# Patient Record
Sex: Male | Born: 1970 | Race: Black or African American | Hispanic: No | Marital: Married | Smoking: Current every day smoker
Health system: Southern US, Community
[De-identification: ages and names within clinical notes are randomized; demographics above are authoritative.]

## PROBLEM LIST (undated history)

## (undated) DIAGNOSIS — K219 Gastro-esophageal reflux disease without esophagitis: Secondary | ICD-10-CM

## (undated) DIAGNOSIS — W3400XA Accidental discharge from unspecified firearms or gun, initial encounter: Secondary | ICD-10-CM

## (undated) DIAGNOSIS — S92009A Unspecified fracture of unspecified calcaneus, initial encounter for closed fracture: Secondary | ICD-10-CM

## (undated) DIAGNOSIS — I10 Essential (primary) hypertension: Secondary | ICD-10-CM

## (undated) HISTORY — DX: Gastro-esophageal reflux disease without esophagitis: K21.9

---

## 2016-01-22 ENCOUNTER — Encounter (HOSPITAL_COMMUNITY): Payer: Self-pay | Admitting: Emergency Medicine

## 2016-01-22 DIAGNOSIS — F17200 Nicotine dependence, unspecified, uncomplicated: Secondary | ICD-10-CM | POA: Diagnosis not present

## 2016-01-22 DIAGNOSIS — M545 Low back pain: Secondary | ICD-10-CM | POA: Diagnosis not present

## 2016-01-22 DIAGNOSIS — I10 Essential (primary) hypertension: Secondary | ICD-10-CM | POA: Insufficient documentation

## 2016-01-22 LAB — URINALYSIS, ROUTINE W REFLEX MICROSCOPIC
Bilirubin Urine: NEGATIVE
GLUCOSE, UA: NEGATIVE mg/dL
HGB URINE DIPSTICK: NEGATIVE
KETONES UR: NEGATIVE mg/dL
LEUKOCYTES UA: NEGATIVE
Nitrite: NEGATIVE
PH: 8 (ref 5.0–8.0)
Protein, ur: NEGATIVE mg/dL
Specific Gravity, Urine: 1.021 (ref 1.005–1.030)

## 2016-01-22 MED ORDER — OXYCODONE-ACETAMINOPHEN 5-325 MG PO TABS
ORAL_TABLET | ORAL | Status: DC
Start: 2016-01-22 — End: 2016-01-23
  Filled 2016-01-22: qty 1

## 2016-01-22 MED ORDER — OXYCODONE-ACETAMINOPHEN 5-325 MG PO TABS
1.0000 | ORAL_TABLET | ORAL | Status: DC | PRN
  Administered 2016-01-22: 1 via ORAL

## 2016-01-22 NOTE — ED Notes (Addendum)
Pt. reports low back pain onset yesterday unrelieved by OTC Aleve, denies injury or fall , no urinary discomfort or hematuria , pain increases with movement / changing positions .

## 2016-01-23 ENCOUNTER — Emergency Department (HOSPITAL_COMMUNITY)
Admission: EM | Admit: 2016-01-23 | Discharge: 2016-01-23 | Disposition: A | Discharge status: Home / Self Care | Payer: Medicaid Other | Attending: Emergency Medicine | Admitting: Emergency Medicine

## 2016-01-23 HISTORY — DX: Essential (primary) hypertension: I10

## 2016-01-23 NOTE — ED Notes (Signed)
lwbs

## 2016-01-31 ENCOUNTER — Emergency Department (HOSPITAL_COMMUNITY): Payer: Medicaid Other

## 2016-01-31 ENCOUNTER — Encounter (HOSPITAL_COMMUNITY): Payer: Self-pay | Admitting: Emergency Medicine

## 2016-01-31 ENCOUNTER — Observation Stay (HOSPITAL_COMMUNITY)
Admission: EM | Admit: 2016-01-31 | Discharge: 2016-01-31 | Disposition: A | Discharge status: Home / Self Care | Payer: Medicaid Other | Attending: Family Medicine | Admitting: Family Medicine

## 2016-01-31 DIAGNOSIS — I10 Essential (primary) hypertension: Secondary | ICD-10-CM | POA: Diagnosis not present

## 2016-01-31 DIAGNOSIS — M940 Chondrocostal junction syndrome [Tietze]: Secondary | ICD-10-CM

## 2016-01-31 DIAGNOSIS — F17200 Nicotine dependence, unspecified, uncomplicated: Secondary | ICD-10-CM | POA: Insufficient documentation

## 2016-01-31 DIAGNOSIS — K219 Gastro-esophageal reflux disease without esophagitis: Secondary | ICD-10-CM | POA: Diagnosis not present

## 2016-01-31 DIAGNOSIS — R0602 Shortness of breath: Secondary | ICD-10-CM | POA: Insufficient documentation

## 2016-01-31 DIAGNOSIS — R079 Chest pain, unspecified: Principal | ICD-10-CM | POA: Diagnosis present

## 2016-01-31 DIAGNOSIS — K210 Gastro-esophageal reflux disease with esophagitis: Secondary | ICD-10-CM

## 2016-01-31 LAB — I-STAT TROPONIN, ED
TROPONIN I, POC: 0 ng/mL (ref 0.00–0.08)
Troponin i, poc: 0 ng/mL (ref 0.00–0.08)

## 2016-01-31 LAB — BASIC METABOLIC PANEL
Anion gap: 7 (ref 5–15)
BUN: 10 mg/dL (ref 6–20)
CALCIUM: 8.6 mg/dL — AB (ref 8.9–10.3)
CO2: 24 mmol/L (ref 22–32)
CREATININE: 1.06 mg/dL (ref 0.61–1.24)
Chloride: 110 mmol/L (ref 101–111)
Glucose, Bld: 92 mg/dL (ref 65–99)
POTASSIUM: 3.4 mmol/L — AB (ref 3.5–5.1)
Sodium: 141 mmol/L (ref 135–145)

## 2016-01-31 LAB — CBC
HCT: 35.1 % — ABNORMAL LOW (ref 39.0–52.0)
Hemoglobin: 11.8 g/dL — ABNORMAL LOW (ref 13.0–17.0)
MCH: 28.4 pg (ref 26.0–34.0)
MCHC: 33.6 g/dL (ref 30.0–36.0)
MCV: 84.4 fL (ref 78.0–100.0)
PLATELETS: 237 10*3/uL (ref 150–400)
RBC: 4.16 MIL/uL — ABNORMAL LOW (ref 4.22–5.81)
RDW: 13.6 % (ref 11.5–15.5)
WBC: 10.2 10*3/uL (ref 4.0–10.5)

## 2016-01-31 LAB — TROPONIN I: Troponin I: <0.03 ng/mL (ref <0.031)

## 2016-01-31 LAB — D-DIMER, QUANTITATIVE (NOT AT ARMC)

## 2016-01-31 MED ORDER — PANTOPRAZOLE SODIUM 40 MG IV SOLR: 40.0000 mg | Freq: Once | INTRAVENOUS | Status: DC

## 2016-01-31 MED ORDER — ESOMEPRAZOLE MAGNESIUM 40 MG PO CPDR: 40.0000 mg | DELAYED_RELEASE_CAPSULE | Freq: Every day | ORAL | Status: DC

## 2016-01-31 MED ORDER — SODIUM CHLORIDE 0.9 % IV SOLN
INTRAVENOUS | Status: DC
Start: 2016-01-31 — End: 2016-01-31
  Administered 2016-01-31: 14:00:00 via INTRAVENOUS

## 2016-01-31 MED ORDER — ONDANSETRON HCL 4 MG/2ML IJ SOLN
4.0000 mg | Freq: Once | INTRAMUSCULAR | Status: AC
  Administered 2016-01-31: 4 mg via INTRAVENOUS
  Filled 2016-01-31: qty 2

## 2016-01-31 MED ORDER — MORPHINE SULFATE (PF) 2 MG/ML IV SOLN
2.0000 mg | Freq: Once | INTRAVENOUS | Status: AC
  Administered 2016-01-31: 2 mg via INTRAVENOUS
  Filled 2016-01-31: qty 1

## 2016-01-31 MED ORDER — FAMOTIDINE 20 MG PO TABS: 20.0000 mg | ORAL_TABLET | Freq: Two times a day (BID) | ORAL | Status: DC

## 2016-01-31 MED ORDER — PANTOPRAZOLE SODIUM 40 MG IV SOLR
80.0000 mg | Freq: Once | INTRAVENOUS | Status: AC
  Administered 2016-01-31: 80 mg via INTRAVENOUS
  Filled 2016-01-31: qty 80

## 2016-01-31 MED ORDER — GI COCKTAIL ~~LOC~~
30.0000 mL | Freq: Once | ORAL | Status: AC
  Administered 2016-01-31: 30 mL via ORAL
  Filled 2016-01-31: qty 30

## 2016-01-31 MED ORDER — SODIUM CHLORIDE 0.9 % IV BOLUS (SEPSIS)
500.0000 mL | Freq: Once | INTRAVENOUS | Status: AC
  Administered 2016-01-31: 500 mL via INTRAVENOUS

## 2016-01-31 NOTE — ED Notes (Signed)
EMS - Central chest pain with sudden onset around 10am today with radiation to left chest.  3 Nitros and 324mg  Aspirin originally 6/10 down to 3/10 in pain.

## 2016-01-31 NOTE — ED Provider Notes (Addendum)
CSN: [id number]     Arrival date & time 01/31/16  1111 History   First MD Initiated Contact with Patient 01/31/16 1120     Chief Complaint  Patient presents with  . Chest Pain     (Consider location/radiation/quality/duration/timing/severity/associated sxs/prior Treatment) Patient is a 45 y.o. male presenting with chest pain. The history is provided by the patient and the EMS personnel.  Chest Pain Associated symptoms: shortness of breath   Associated symptoms: no abdominal pain, no back pain, no cough, no fever and no headache   Patient with acute onset of anterior chest pain left and right at 10:15 this morning. Associated with shortness of breath. Pain at its worst was 10 out of 10. Pain currently 2 out of 10. Associated with nausea no vomiting. Never had pain like this. Patient has cardiac risk factors of hypertension and began a smoker. No known cardiac disease. No family history of cardiac disease. Patient given aspirin and 3 nitroglycerin by EMS without significant change in the pain. Patient's pain is not changed by movement or changing positions in the chest. But the low back pain does do that. Patient's had low back pain chronically for a long period of time. He is not here for that. He is here for the anterior chest pain.  Past Medical History  Diagnosis Date  . Hypertension    History reviewed. No pertinent past surgical history. No family history on file. Social History  Substance Use Topics  . Smoking status: Current Every Day Smoker  . Smokeless tobacco: None  . Alcohol Use: Yes    Review of Systems  Constitutional: Negative for fever.  HENT: Negative for congestion.   Eyes: Negative for visual disturbance.  Respiratory: Positive for shortness of breath. Negative for cough.   Cardiovascular: Positive for chest pain. Negative for leg swelling.  Gastrointestinal: Negative for abdominal pain.  Genitourinary: Negative for dysuria.  Musculoskeletal: Negative for back  pain.  Skin: Negative for rash.  Neurological: Negative for headaches.  Hematological: Does not bruise/bleed easily.  Psychiatric/Behavioral: Negative for confusion.      Allergies  Review of patient's allergies indicates no known allergies.  Home Medications   Prior to Admission medications   Medication Sig Start Date End Date Taking? Authorizing Provider  naproxen sodium (ANAPROX) 220 MG tablet Take 220 mg by mouth 2 (two) times daily as needed (pain).   Yes Historical Provider, MD   BP 115/88 mmHg  Pulse 66  Temp(Src) 97.9 F (36.6 C) (Oral)  Resp 21  Ht  (1.778 m)  Wt 68.04 kg  BMI 21.52 kg/m2  SpO2 100% Physical Exam  Constitutional: He is oriented to person, place, and time. He appears well-developed and well-nourished. No distress.  HENT:  Head: Normocephalic and atraumatic.  Mouth/Throat: Oropharynx is clear and moist.  Eyes: Conjunctivae and EOM are normal. Pupils are equal, round, and reactive to light.  Neck: Normal range of motion. Neck supple.  Cardiovascular: Normal rate, regular rhythm and normal heart sounds.   Pulmonary/Chest: Effort normal and breath sounds normal. No respiratory distress. He exhibits no tenderness.  Abdominal: Soft. Bowel sounds are normal. There is no tenderness.  Musculoskeletal: Normal range of motion. He exhibits no edema.  Neurological: He is alert and oriented to person, place, and time. No cranial nerve deficit. He exhibits normal muscle tone. Coordination normal.  Skin: Skin is warm. No rash noted.  Nursing note and vitals reviewed.   ED Course  Procedures (including critical care time) Labs Review  Labs Reviewed  BASIC METABOLIC PANEL - Abnormal; Notable for the following:    Potassium 3.4 (*)    Calcium 8.6 (*)    All other components within normal limits  CBC - Abnormal; Notable for the following:    RBC 4.16 (*)    Hemoglobin 11.8 (*)    HCT 35.1 (*)    All other components within normal limits  D-DIMER,  QUANTITATIVE (NOT AT [facility])  I-STAT TROPOININ, ED   Results for orders placed or performed during the hospital encounter of 01/31/16  Basic metabolic panel  Result Value Ref Range   Sodium 141 135 - 145 mmol/L   Potassium 3.4 (L) 3.5 - 5.1 mmol/L   Chloride 110 101 - 111 mmol/L   CO2 24 22 - 32 mmol/L   Glucose, Bld 92 65 - 99 mg/dL   BUN 10 6 - 20 mg/dL   Creatinine, Ser 1.61 0.61 - 1.24 mg/dL   Calcium 8.6 (L) 8.9 - 10.3 mg/dL   GFR calc non Af Amer >60 >60 mL/min   GFR calc Af Amer >60 >60 mL/min   Anion gap 7 5 - 15  CBC  Result Value Ref Range   WBC 10.2 4.0 - 10.5 K/uL   RBC 4.16 (L) 4.22 - 5.81 MIL/uL   Hemoglobin 11.8 (L) 13.0 - 17.0 g/dL   HCT 09.6 (L) 04.5 - 40.9 %   MCV 84.4 78.0 - 100.0 fL   MCH 28.4 26.0 - 34.0 pg   MCHC 33.6 30.0 - 36.0 g/dL   RDW 81.1 91.4 - 78.2 %   Platelets 237 150 - 400 K/uL  D-dimer, quantitative (not at [facility])  Result Value Ref Range   D-Dimer, Quant <0.27 0.00 - 0.50 ug/mL-FEU  I-stat troponin, ED (not at [facility])  Result Value Ref Range   Troponin i, poc 0.00 0.00 - 0.08 ng/mL   Comment 3             Imaging Review Dg Chest 2 View  01/31/2016  CLINICAL DATA:  45 year old male with mid chest pain since this morning. Initial encounter. EXAM: CHEST  2 VIEW COMPARISON:  None. FINDINGS: Normal lung volumes. Normal cardiac size and mediastinal contours. Visualized tracheal air column is within normal limits. No pneumothorax, pulmonary edema, pleural effusion or consolidation. Very mild nonspecific streaky opacity in the lingula. This partially obscures the contour of the left hemidiaphragm the PA view. No osseous abnormality identified. IMPRESSION: Mild nonspecific opacity in the lingula.  Query bronchopneumonia. No pleural effusion or other acute cardiopulmonary abnormality. Electronically Signed   By: Odessa Fleming M.D.   On: 01/31/2016 11:50   I have personally reviewed and evaluated these images and lab results as part of my medical  decision-making.   EKG Interpretation   Date/Time:  Wednesday January 31 2016 11:16:54 EDT Ventricular Rate:  69 PR Interval:  166 QRS Duration: 84 QT Interval:  390 QTC Calculation: 418 R Axis:   78 Text Interpretation:  Sinus rhythm Ventricular premature complex LVH with  secondary repolarization abnormality No previous ECGs available Confirmed  by Diana Davenport  MD, Karri Kallenbach ([id number]) on 01/31/2016 11:20:11 AM      MDM   Final diagnoses:  Chest pain, unspecified chest pain type   Patient's chest x-ray raises concerns for lingular pneumonia. The patient's symptoms don't seem to fit that very well. Patient has no evidence of pulmonary embolus based on a negative d-dimer. Sats are in the upper 90s. Chest x-ray otherwise negative. First troponin negative.  Patient will be admitted to observation telemetry for chest pain rule out. Patient's never had pain like this before. Patient without leukocytosis no fevers. Feel that the pneumonia is an outlier. However something to keep in mind.     Vanetta MuldersScott Revan Gendron, MD 01/31/16 1519  Addendum:  Patient revealed to the admitting hospitalist that he has a history of reflux disease and that he's been out of his medicines and that this may be reminded him of what his reflexes acted up before. They're recommending GI cocktail Protonix will have to do a serial troponin. Perhaps if the chest pain improves significantly with that and if the second troponin is negative patient could be considered for discharge home. If he doesn't have significant improvement he probably will require admission.  Patient seen by Doretha Souavid Marilyn he will put a note on the chart. Patient's heart score is 2.  Vanetta MuldersScott Kennet Mccort, MD 01/31/16 1550

## 2016-01-31 NOTE — Consult Note (Signed)
[facility] Medical Consultation  Jadrian Bulman ZOX:[id number] DOB: 1971-09-20 DOA: 01/31/2016 PCP: No PCP Per Patient   Requesting physician: Date of consultation:  Reason for consultation: Impression/Recommendations Principal Problem:   Chest pain Active Problems:   GERD (gastroesophageal reflux disease)   Hypertension   Chest pain syndrome, cardiac versus GERD vs. MSK. Pain reproducible on exam. Heart score 2  EKG shows NSR with ventricular premature complexes/LVH  and  QTC 418.  CMET and CBC are essentially normal. Troponins x2 are negative DDMER <0.27  CXR with mild non specific opacity lingula without cardiopulmonary abnormality or pleural effusion, query bronchopneumonia per radiologist, but Negative per attending MD. Resp exam is negative.   Can be d/c home and follow up with PCP/cards if symptoms persist   GI cocktail and Protonix for GI symptoms.   ADDENDUM:patient is now pain free after receiving Protonix and GI cocktail. He can be discharged to home with a refill of Nexium  History of GERD. Patient takes Nexium as OP. He is followed at [facility] for severe GERD.  Recommend Protonix as Outpatient Avoid Anaprox as it may worsen GERD symptoms Follow at [facility] GI symptoms persist.   History Hypertension VSS  He is Not on meds    HPI: 45 year old man a history of GERD followed [facility], on chronic Nexium, presenting with sudden onset of CP since 10 am. Described as 6/10, sharp, worsened with deep inspiration and movement or exertion while at work. Patient took a baby aspirin.On transport he received NTG without much relief.  Denies any jaw pain. Denies any dizziness or falls.denies SOB or cough. Denies any fever or chills. Reported  Nausea when symptoms began, but no vomiting or diarrhea. He reports some bloating. He reports that run out of Nexium 2 days ago. Appetite is normal. Denies any leg swelling or calf pain. Denies any left arm pain. Denies any headaches or vision  changes. Denies any prior history of cardiac disease. The patient never had an echocardiogram or seen by Cardiology. No recent long distance trips. Denies any new stressors. No new meds. No new herbal supplements.He smokes. No ETOH or recreational drugs.  At the ED,patient received ASA and Nitroglycerin.  NSR with ventricular premature complexes/LVH  and  QTC 418.  CMET and CBC are essentially normal. Troponins are negative to date, 0.01. DDMER <0.27  CXR with mild non specific opacity lingula without cardiopulmonary abnormality or pleural effusion, query bronchopneumonia. VSS, Afebrile.   Review of Systems:  See HPI for significant positives, rest of ROS neg. A full 10 point Review of Systems was done, except as stated above, all other Review of Systems were negative.   Past Medical History  Diagnosis Date  . Hypertension    History reviewed. No pertinent past surgical history. Social History:  reports that he has been smoking.  He does not have any smokeless tobacco history on file. He reports that he drinks alcohol. He reports that he uses illicit drugs (Marijuana).  No Known Allergies No family history on file.  Prior to Admission medications   Medication Sig Start Date End Date Taking? Authorizing Provider  naproxen sodium (ANAPROX) 220 MG tablet Take 220 mg by mouth 2 (two) times daily as needed (pain).   Yes Historical Provider, MD   Blood pressure 125/87, pulse 76, temperature 97.9 F (36.6 C), temperature source Oral, resp. rate 17, height  (1.778 m), weight 68.04 kg (150 lb), SpO2 100 %.  Filed Vitals:   01/31/16 1500 01/31/16 1515  BP: 126/91 125/87  Pulse: 66 76  Temp:    Resp: 11 17   Physical Exam: General:  lying in bed in NAD, conversant HEENT:  appear Normal, Conjunctivae clear, PERRLA. Moist Oral Mucosa. Neck: Supple Neck, No JVD, No cervical lymphadenopathy appriciated, No Carotid Bruits. Lung: Symmetrical Chest wall movement, Good air movement  bilaterally, CTAB. Cardio: RRR, No Gallops, Rubs or Murmurs, No Parasternal Heave. Abdomen: Positive Bowel Sounds, Abdomen Soft, No tenderness, No organomegaly appreciated,No rebound -guarding or rigidity. Skin: No Cyanosis, Normal Skin Turgor, No Skin Rash or Bruise. Musculoskeletal: Good muscle tone, joints appear normal, no effusions, Normal ROM.  Left Chest wall pain reporducible Lymph: No Palpable Lymph Nodes in Neck or Axillae Psych : Normal affect and insight, Not Suicidal or Homicidal, Awake Alert, Oriented X 3.  Neuro: No F.N deficits, al  Cranial Nerves Intact, Strength 5/5 all 4 extremities, Sensation intact all 4 extremities, Plantars down going.   Labs on Admission:  Basic Metabolic Panel:  Recent Labs Lab 01/31/16 1129  NA 141  K 3.4*  CL 110  CO2 24  GLUCOSE 92  BUN 10  CREATININE 1.06  CALCIUM 8.6*   Liver Function Tests: No results for input(s): AST, ALT, ALKPHOS, BILITOT, PROT, ALBUMIN in the last 168 hours. No results for input(s): LIPASE, AMYLASE in the last 168 hours. No results for input(s): AMMONIA in the last 168 hours. CBC:  Recent Labs Lab 01/31/16 1129  WBC 10.2  HGB 11.8*  HCT 35.1*  MCV 84.4  PLT 237   Cardiac Enzymes: No results for input(s): CKTOTAL, CKMB, CKMBINDEX, TROPONINI in the last 168 hours. BNP: Invalid input(s): POCBNP CBG: No results for input(s): GLUCAP in the last 168 hours.  Radiological Exams on Admission: Dg Chest 2 View  01/31/2016  CLINICAL DATA:  45 year old male with mid chest pain since this morning. Initial encounter. EXAM: CHEST  2 VIEW COMPARISON:  None. FINDINGS: Normal lung volumes. Normal cardiac size and mediastinal contours. Visualized tracheal air column is within normal limits. No pneumothorax, pulmonary edema, pleural effusion or consolidation. Very mild nonspecific streaky opacity in the lingula. This partially obscures the contour of the left hemidiaphragm the PA view. No osseous abnormality identified.  IMPRESSION: Mild nonspecific opacity in the lingula.  Query bronchopneumonia. No pleural effusion or other acute cardiopulmonary abnormality. Electronically Signed   By: Odessa FlemingH  Hall M.D.   On: 01/31/2016 11:50      [facility] Hospital Medical CenterWERTMAN,Jaana Brodt E [facility]  If 7PM-7AM, please contact night-coverage [email or web address] Password [email or web address] 01/31/2016, 4:30 PM

## 2016-01-31 NOTE — ED Provider Notes (Signed)
5:06 PM care transferred to me. Patient's chest pain has completely resolved with GI cocktail. Discussed with Dr. Konrad DoloresMerrell who recommends discharge and they will place a consult note. Asks for a prescription to refill his Nexium. We'll also give Pepcid. Discussed that while this likely is GERD there is still a chance this is still cardiac and if his symptoms come back or worsen he needs to be reevaluated. Otherwise follow-up with a PCP.  Pricilla LovelessScott Billey Wojciak, MD 01/31/16 [phone]

## 2016-01-31 NOTE — ED Notes (Signed)
Patient alert and oriented at discharge.  Patient ambulatory with this RN to the waiting room.

## 2016-02-01 DIAGNOSIS — M940 Chondrocostal junction syndrome [Tietze]: Secondary | ICD-10-CM | POA: Insufficient documentation

## 2016-04-30 ENCOUNTER — Encounter (HOSPITAL_COMMUNITY): Payer: Self-pay | Admitting: Emergency Medicine

## 2016-04-30 ENCOUNTER — Emergency Department (HOSPITAL_COMMUNITY)
Admission: EM | Admit: 2016-04-30 | Discharge: 2016-04-30 | Disposition: A | Discharge status: Home / Self Care | Payer: MEDICAID | Attending: Emergency Medicine | Admitting: Emergency Medicine

## 2016-04-30 DIAGNOSIS — IMO0001 Reserved for inherently not codable concepts without codable children: Secondary | ICD-10-CM

## 2016-04-30 DIAGNOSIS — F17210 Nicotine dependence, cigarettes, uncomplicated: Secondary | ICD-10-CM | POA: Insufficient documentation

## 2016-04-30 DIAGNOSIS — R030 Elevated blood-pressure reading, without diagnosis of hypertension: Secondary | ICD-10-CM | POA: Insufficient documentation

## 2016-04-30 DIAGNOSIS — Z79899 Other long term (current) drug therapy: Secondary | ICD-10-CM | POA: Insufficient documentation

## 2016-04-30 NOTE — Discharge Instructions (Signed)
Please follow-up with your doctor or the community health [facility] wellness [facility] to establish primary care and for blood pressure recheck and management. Be sure to drink lots of water, 8, 8 ounce glasses per day. Return to ED for any new or worsening symptoms.  Hypertension Hypertension is another name for high blood pressure. High blood pressure forces your heart to work harder to pump blood. A blood pressure reading has two numbers, which includes a higher number over a lower number (example: 110/72). HOME CARE   Have your blood pressure rechecked by your doctor.  Only take medicine as told by your doctor. Follow the directions carefully. The medicine does not work as well if you skip doses. Skipping doses also puts you at risk for problems.  Do not smoke.  Monitor your blood pressure at home as told by your doctor. GET HELP IF:  You think you are having a reaction to the medicine you are taking.  You have repeat headaches or feel dizzy.  You have puffiness (swelling) in your ankles.  You have trouble with your vision. GET HELP RIGHT AWAY IF:   You get a very bad headache and are confused.  You feel weak, numb, or faint.  You get chest or belly (abdominal) pain.  You throw up (vomit).  You cannot breathe very well. MAKE SURE YOU:   Understand these instructions.  Will watch your condition.  Will get help right away if you are not doing well or get worse.   This information is not intended to replace advice given to you by your health care provider. Make sure you discuss any questions you have with your health care provider.   Document Released: 04/08/2008 Document Revised: 10/26/2013 Document Reviewed: 08/13/2013 Elsevier Interactive Patient Education [employer]2016 Elsevier Inc.

## 2016-04-30 NOTE — ED Notes (Signed)
PA at bedside.

## 2016-04-30 NOTE — ED Provider Notes (Signed)
CSN: [id number]     Arrival date & time 04/30/16  1250 History   First MD Initiated Contact with Patient 04/30/16 1351     Chief Complaint  Patient presents with  . Hypertension     (Consider location/radiation/quality/duration/timing/severity/associated sxs/prior Treatment) HPI Eric Parsons is a 45 y.o. male with a reported history of hypertension, here for evaluation of elevated blood pressure. Patient reports he was at [facility], checked his blood pressure was 140/97. He reports running out of his previous blood pressure medications about 4 months ago when he moved from [place]. He does not know what medications he was taking. He denies any headache, vision changes, urinary symptoms, chest pain or shortness of breath. 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 He reports the sensation resolved shortly. Denies adequate water hydration. No other focal numbness or weakness. No other modifying factors.  Past Medical History  Diagnosis Date  . Hypertension    History reviewed. No pertinent past surgical history. Family History  Problem Relation Age of Onset  . Hypertension Mother   . Diabetes Mother    Social History  Substance Use Topics  . Smoking status: Current Every Day Smoker -- 0.50 packs/day    Types: Cigarettes  . Smokeless tobacco: None  . Alcohol Use: Yes    Review of Systems A 10 point review of systems was completed and was negative except for pertinent positives and negatives as mentioned in the history of present illness     Allergies  Review of patient's allergies indicates no known allergies.  Home Medications   Prior to Admission medications   Medication Sig Start Date End Date Taking? Authorizing Provider  esomeprazole (NEXIUM) 40 MG capsule Take 1 capsule (40 mg total) by mouth daily. 01/31/16  Yes Pricilla LovelessScott Goldston, MD  famotidine (PEPCID) 20 MG tablet Take 1 tablet (20 mg total) by mouth 2  (two) times daily. 01/31/16  Yes Pricilla LovelessScott Goldston, MD   BP 148/110 mmHg  Pulse 68  Temp(Src) 98.4 F (36.9 C) (Oral)  Resp 17  Ht 5\' 10"  (1.778 m)  Wt 68.04 kg  BMI 21.52 kg/m2  SpO2 100% Physical Exam  Constitutional: He is oriented to person, place, and time. He appears well-developed and well-nourished. No distress.  HENT:  Head: Normocephalic and atraumatic.  Mouth/Throat: Oropharynx is clear and moist.  Eyes: Conjunctivae and EOM are normal. Pupils are equal, round, and reactive to light. Right eye exhibits no discharge. Left eye exhibits no discharge. No scleral icterus.  Neck: Normal range of motion. Neck supple.  Cardiovascular: Normal rate, regular rhythm and normal heart sounds.   Pulmonary/Chest: Effort normal and breath sounds normal. No respiratory distress. He has no wheezes. He has no rales.  Abdominal: Soft. There is no tenderness.  Musculoskeletal: Normal range of motion. He exhibits no edema or tenderness.  Neurological: He is alert and oriented to person, place, and time.  Cranial Nerves II-XII grossly intact  Skin: Skin is warm and dry. No rash noted. He is not diaphoretic.  Psychiatric: He has a normal mood and affect.  Nursing note and vitals reviewed.   ED Course  Procedures (including critical care time) Labs Review Labs Reviewed - No data to display  Imaging Review No results found. I have personally reviewed and evaluated these images and lab results as part of my medical decision-making.   EKG Interpretation None      MDM  Overall  well-appearing in here for evaluation of hypertension. He [facility] and came here for evaluation. No evidence of hypertensive emergency or urgency. Patient is asymptomatic in emergency department. Discussed importance of oral rehydration. Will give a referral to [facility] wellness Center for PCP follow-up and blood pressure recheck. Final diagnoses:  Elevated blood  pressure        Joycie PeekBenjamin Kiowa Peifer, PA-C 04/30/16 1558  Derwood KaplanAnkit Nanavati, MD 05/01/16 1413

## 2016-04-30 NOTE — ED Notes (Signed)
Patient presents for hypertension. Reports [facility] to [facility] and [facility] was 140[facility]97. Patient c/o lightheadedness and generalized weakness. History of hypertension, ran out of medication "months ago". Patient A&O x4, ambulatory with steady gait.

## 2016-05-17 ENCOUNTER — Emergency Department (HOSPITAL_COMMUNITY)
Admission: EM | Admit: 2016-05-17 | Discharge: 2016-05-17 | Disposition: A | Discharge status: Home / Self Care | Payer: Self-pay | Attending: Emergency Medicine | Admitting: Emergency Medicine

## 2016-05-17 ENCOUNTER — Encounter (HOSPITAL_COMMUNITY): Payer: Self-pay | Admitting: Emergency Medicine

## 2016-05-17 ENCOUNTER — Emergency Department (HOSPITAL_COMMUNITY): Payer: Self-pay

## 2016-05-17 DIAGNOSIS — R112 Nausea with vomiting, unspecified: Secondary | ICD-10-CM | POA: Insufficient documentation

## 2016-05-17 DIAGNOSIS — F17210 Nicotine dependence, cigarettes, uncomplicated: Secondary | ICD-10-CM | POA: Insufficient documentation

## 2016-05-17 DIAGNOSIS — R109 Unspecified abdominal pain: Secondary | ICD-10-CM

## 2016-05-17 DIAGNOSIS — R197 Diarrhea, unspecified: Secondary | ICD-10-CM | POA: Insufficient documentation

## 2016-05-17 DIAGNOSIS — R1084 Generalized abdominal pain: Secondary | ICD-10-CM | POA: Insufficient documentation

## 2016-05-17 DIAGNOSIS — Z79899 Other long term (current) drug therapy: Secondary | ICD-10-CM | POA: Insufficient documentation

## 2016-05-17 DIAGNOSIS — I10 Essential (primary) hypertension: Secondary | ICD-10-CM | POA: Insufficient documentation

## 2016-05-17 LAB — COMPREHENSIVE METABOLIC PANEL
ALBUMIN: 3.4 g/dL — AB (ref 3.5–5.0)
ALK PHOS: 45 U/L (ref 38–126)
ALT: 19 U/L (ref 17–63)
ANION GAP: 11 (ref 5–15)
AST: 20 U/L (ref 15–41)
BILIRUBIN TOTAL: 0.4 mg/dL (ref 0.3–1.2)
BUN: 12 mg/dL (ref 6–20)
CALCIUM: 9.2 mg/dL (ref 8.9–10.3)
CO2: 24 mmol/L (ref 22–32)
Chloride: 103 mmol/L (ref 101–111)
Creatinine, Ser: 1.23 mg/dL (ref 0.61–1.24)
GFR calc non Af Amer: >60 mL/min (ref >60)
Glucose, Bld: 94 mg/dL (ref 65–99)
POTASSIUM: 3 mmol/L — AB (ref 3.5–5.1)
SODIUM: 138 mmol/L (ref 135–145)
TOTAL PROTEIN: 6.3 g/dL — AB (ref 6.5–8.1)

## 2016-05-17 LAB — URINALYSIS, ROUTINE W REFLEX MICROSCOPIC
BILIRUBIN URINE: NEGATIVE
Glucose, UA: NEGATIVE mg/dL
HGB URINE DIPSTICK: NEGATIVE
Ketones, ur: NEGATIVE mg/dL
Leukocytes, UA: NEGATIVE
NITRITE: NEGATIVE
PROTEIN: NEGATIVE mg/dL
Specific Gravity, Urine: 1.031 — ABNORMAL HIGH (ref 1.005–1.030)
pH: 7.5 (ref 5.0–8.0)

## 2016-05-17 LAB — CBC
HCT: 38.3 % — ABNORMAL LOW (ref 39.0–52.0)
HEMOGLOBIN: 13 g/dL (ref 13.0–17.0)
MCH: 28.4 pg (ref 26.0–34.0)
MCHC: 33.9 g/dL (ref 30.0–36.0)
MCV: 83.6 fL (ref 78.0–100.0)
PLATELETS: 228 10*3/uL (ref 150–400)
RBC: 4.58 MIL/uL (ref 4.22–5.81)
RDW: 13.5 % (ref 11.5–15.5)
WBC: 8.9 10*3/uL (ref 4.0–10.5)

## 2016-05-17 LAB — MAGNESIUM: MAGNESIUM: 2 mg/dL (ref 1.7–2.4)

## 2016-05-17 LAB — TROPONIN I: Troponin I: <0.03 ng/mL (ref <0.03)

## 2016-05-17 LAB — LIPASE, BLOOD: Lipase: 23 U/L (ref 11–51)

## 2016-05-17 LAB — POC OCCULT BLOOD, ED: Fecal Occult Bld: NEGATIVE

## 2016-05-17 MED ORDER — ONDANSETRON HCL 4 MG PO TABS: 4.0000 mg | ORAL_TABLET | Freq: Four times a day (QID) | ORAL | Status: DC

## 2016-05-17 MED ORDER — MORPHINE SULFATE (PF) 4 MG/ML IV SOLN
4.0000 mg | Freq: Once | INTRAVENOUS | Status: AC
  Administered 2016-05-17: 4 mg via INTRAVENOUS
  Filled 2016-05-17: qty 1

## 2016-05-17 MED ORDER — POTASSIUM CHLORIDE CRYS ER 20 MEQ PO TBCR
40.0000 meq | EXTENDED_RELEASE_TABLET | Freq: Once | ORAL | Status: AC
  Administered 2016-05-17: 40 meq via ORAL
  Filled 2016-05-17: qty 2

## 2016-05-17 MED ORDER — SODIUM CHLORIDE 0.9 % IV BOLUS (SEPSIS)
1000.0000 mL | Freq: Once | INTRAVENOUS | Status: AC
  Administered 2016-05-17: 1000 mL via INTRAVENOUS

## 2016-05-17 MED ORDER — SUCRALFATE 1 GM/10ML PO SUSP: 1.0000 g | Freq: Three times a day (TID) | ORAL | Status: DC

## 2016-05-17 MED ORDER — IOPAMIDOL (ISOVUE-300) INJECTION 61%
INTRAVENOUS | Status: AC
  Administered 2016-05-17: 100 mL
  Filled 2016-05-17: qty 100

## 2016-05-17 MED ORDER — ONDANSETRON HCL 4 MG/2ML IJ SOLN
4.0000 mg | Freq: Once | INTRAMUSCULAR | Status: AC
  Administered 2016-05-17: 4 mg via INTRAVENOUS
  Filled 2016-05-17: qty 2

## 2016-05-17 NOTE — ED Notes (Signed)
Occult Blood Card at bedside.  

## 2016-05-17 NOTE — ED Notes (Signed)
Pt tolerating water with no vomitting

## 2016-05-17 NOTE — ED Notes (Signed)
Pt in from home with c/o n/v/d, abd pain and cp x4 days. Per pt, this has been occuring off and on x 7 years. Pt reports having dark, oily emesis and dark stools since last night. Pt takes Nexium and Pepcid for heartburn. A&0x4, VSS, afebrile

## 2016-05-17 NOTE — Discharge Instructions (Signed)
Medications: Zofran, Carafate  Treatment: Take Zofran every 6 hours as needed for nausea and vomiting. Take Carafate 4 times daily with meals and at bedtime for your pain and stomach upset. Take Tylenol over-the-counter every 4-6 hours as prescribed for your pain. Do not take NSAID medications like ibuprofen or Aleve until you see the gastroenterologist.  Follow-up: Please follow-up with [facility] for further evaluation and treatment of your symptoms. Please return to emergency department if you develop any new or worsening symptoms such as inability to keep fluids down, bloody stools, fever, increasing pain, or any other new or concerning symptom.   Abdominal Pain, Adult Many things can cause abdominal pain. Usually, abdominal pain is not caused by a disease and will improve without treatment. It can often be observed and treated at home. Your health care provider will do a physical exam and possibly order blood tests and X-rays to help determine the seriousness of your pain. However, in many cases, more time must pass before a clear cause of the pain can be found. Before that point, your health care provider may not know if you need more testing or further treatment. HOME CARE INSTRUCTIONS Monitor your abdominal pain for any changes. The following actions may help to alleviate any discomfort you are experiencing:  Only take over-the-counter or prescription medicines as directed by your health care provider.  Do not take laxatives unless directed to do so by your health care provider.  Try a clear liquid diet (broth, tea, or water) as directed by your health care provider. Slowly move to a bland diet as tolerated. SEEK MEDICAL CARE IF:  You have unexplained abdominal pain.  You have abdominal pain associated with nausea or diarrhea.  You have pain when you urinate or have a bowel movement.  You experience abdominal pain that wakes you in the night.  You have abdominal pain  that is worsened or improved by eating food.  You have abdominal pain that is worsened with eating fatty foods.  You have a fever. SEEK IMMEDIATE MEDICAL CARE IF:  Your pain does not go away within 2 hours.  You keep throwing up (vomiting).  Your pain is felt only in portions of the abdomen, such as the right side or the left lower portion of the abdomen.  You pass bloody or black tarry stools. MAKE SURE YOU:  Understand these instructions.  Will watch your condition.  Will get help right away if you are not doing well or get worse.   This information is not intended to replace advice given to you by your health care provider. Make sure you discuss any questions you have with your health care provider.   Document Released: 07/31/2005 Document Revised: 07/12/2015 Document Reviewed: 06/30/2013 Elsevier Interactive Patient Education 2016 Elsevier Inc.   Nausea and Vomiting Nausea is a sick feeling that often comes before throwing up (vomiting). Vomiting is a reflex where stomach contents come out of your mouth. Vomiting can cause severe loss of body fluids (dehydration). Children and elderly adults can become dehydrated quickly, especially if they also have diarrhea. Nausea and vomiting are symptoms of a condition or disease. It is important to find the cause of your symptoms. CAUSES   Direct irritation of the stomach lining. This irritation can result from increased acid production (gastroesophageal reflux disease), infection, food poisoning, taking certain medicines (such as nonsteroidal anti-inflammatory drugs), alcohol use, or tobacco use.  Signals from the brain.These signals could be caused by a headache, heat exposure, an  inner ear disturbance, increased pressure in the brain from injury, infection, a tumor, or a concussion, pain, emotional stimulus, or metabolic problems.  An obstruction in the gastrointestinal tract (bowel obstruction).  Illnesses such as diabetes,  hepatitis, gallbladder problems, appendicitis, kidney problems, cancer, sepsis, atypical symptoms of a heart attack, or eating disorders.  Medical treatments such as chemotherapy and radiation.  Receiving medicine that makes you sleep (general anesthetic) during surgery. DIAGNOSIS Your caregiver may ask for tests to be done if the problems do not improve after a few days. Tests may also be done if symptoms are severe or if the reason for the nausea and vomiting is not clear. Tests may include:  Urine tests.  Blood tests.  Stool tests.  Cultures (to look for evidence of infection).  X-rays or other imaging studies. Test results can help your caregiver make decisions about treatment or the need for additional tests. TREATMENT You need to stay well hydrated. Drink frequently but in small amounts.You may wish to drink water, sports drinks, clear broth, or eat frozen ice pops or gelatin dessert to help stay hydrated.When you eat, eating slowly may help prevent nausea.There are also some antinausea medicines that may help prevent nausea. HOME CARE INSTRUCTIONS   Take all medicine as directed by your caregiver.  If you do not have an appetite, do not force yourself to eat. However, you must continue to drink fluids.  If you have an appetite, eat a normal diet unless your caregiver tells you differently.  Eat a variety of complex carbohydrates (rice, wheat, potatoes, bread), lean meats, yogurt, fruits, and vegetables.  Avoid high-fat foods because they are more difficult to digest.  Drink enough water and fluids to keep your urine clear or pale yellow.  If you are dehydrated, ask your caregiver for specific rehydration instructions. Signs of dehydration may include:  Severe thirst.  Dry lips and mouth.  Dizziness.  Dark urine.  Decreasing urine frequency and amount.  Confusion.  Rapid breathing or pulse. SEEK IMMEDIATE MEDICAL CARE IF:   You have blood or brown flecks  (like coffee grounds) in your vomit.  You have black or bloody stools.  You have a severe headache or stiff neck.  You are confused.  You have severe abdominal pain.  You have chest pain or trouble breathing.  You do not urinate at least once every 8 hours.  You develop cold or clammy skin.  You continue to vomit for longer than 24 to 48 hours.  You have a fever. MAKE SURE YOU:   Understand these instructions.  Will watch your condition.  Will get help right away if you are not doing well or get worse.   This information is not intended to replace advice given to you by your health care provider. Make sure you discuss any questions you have with your health care provider.   Document Released: 10/21/2005 Document Revised: 01/13/2012 Document Reviewed: 03/20/2011 Elsevier Interactive Patient Education [employer]2016 Elsevier Inc.

## 2016-05-17 NOTE — ED Provider Notes (Signed)
CSN: [id number]     Arrival date & time 05/17/16  0801 History   First MD Initiated Contact with Patient 05/17/16 0809     Chief Complaint  Patient presents with  . Abdominal Pain  . Nausea  . Vomiting     (Consider location/radiation/quality/duration/timing/severity/associated sxs/prior Treatment) HPI Comments: Patient is a 45 year old male who presents with a three-day history of abdominal pain, nausea, vomiting, diarrhea. Patient locates his constant abdominal exam the right upper quadrant, periumbilical, right lower quadrant and radiates to his right low back. Patient has also had constant central chest pain. Patient reports that he has had a dark brown oily vomit, but denies any blood. Patient states he vomited 6-7 times since Tuesday. Patient has also had dark loose stools. He states he was back and forth as well as all day yesterday. Patient has been taking Prilosec without relief. Patient has had these similar symptoms in the past off and on for 7 years. His wife states that it happens about 5-7 years and he sees emergency department every time. He has never had a CT scan or ultrasound. He has been told it was acid reflux and/or ulcers. He has never seen a gastroenterologist and does not have a primary care provider. Patient denies any shortness of breath, fevers, dysuria.  Patient is a 45 y.o. male presenting with abdominal pain. The history is provided by the patient.  Abdominal Pain Associated symptoms: chest pain, diarrhea, nausea and vomiting   Associated symptoms: no chills, no dysuria, no fever, no shortness of breath and no sore throat     Past Medical History  Diagnosis Date  . Hypertension    History reviewed. No pertinent past surgical history. Family History  Problem Relation Age of Onset  . Hypertension Mother   . Diabetes Mother    Social History  Substance Use Topics  . Smoking status: Current Every Day Smoker -- 0.50 packs/day    Types: Cigarettes  .  Smokeless tobacco: Current User    Types: Chew  . Alcohol Use: Yes    Review of Systems  Constitutional: Negative for fever and chills.  HENT: Negative for facial swelling and sore throat.   Respiratory: Negative for shortness of breath.   Cardiovascular: Positive for chest pain.  Gastrointestinal: Positive for nausea, vomiting, abdominal pain and diarrhea.  Genitourinary: Negative for dysuria.  Musculoskeletal: Negative for back pain.  Skin: Negative for rash and wound.  Neurological: Negative for headaches.  Psychiatric/Behavioral: The patient is not nervous/anxious.       Allergies  Review of patient's allergies indicates no known allergies.  Home Medications   Prior to Admission medications   Medication Sig Start Date End Date Taking? Authorizing Provider  ibuprofen (ADVIL,MOTRIN) 200 MG tablet Take 400 mg by mouth every 6 (six) hours as needed for mild pain.   Yes Historical Provider, MD  omeprazole (PRILOSEC) 20 MG capsule Take 20 mg by mouth daily as needed (heartburn / indigestion).   Yes Historical Provider, MD  esomeprazole (NEXIUM) 40 MG capsule Take 1 capsule (40 mg total) by mouth daily. Patient not taking: Reported on 05/17/2016 01/31/16   Pricilla LovelessScott Goldston, MD  famotidine (PEPCID) 20 MG tablet Take 1 tablet (20 mg total) by mouth 2 (two) times daily. Patient not taking: Reported on 05/17/2016 01/31/16   Pricilla LovelessScott Goldston, MD  ondansetron (ZOFRAN) 4 MG tablet Take 1 tablet (4 mg total) by mouth every 6 (six) hours. 05/17/16   Emi HolesAlexandra M Berwyn Bigley, PA-C  sucralfate (CARAFATE) 1 GM/10ML  suspension Take 10 mLs (1 g total) by mouth 4 (four) times daily -  with meals and at bedtime. 05/17/16   Salik Grewell M Richey Doolittle, PA-C   BP 153/94 mmHg  Pulse 70  Temp(Src) 97.7 F (36.5 C) (Oral)  Resp 15  SpO2 100% Physical Exam  Constitutional: He appears well-developed and well-nourished. No distress.  HENT:  Head: Normocephalic and atraumatic.  Mouth/Throat: Oropharynx is clear and moist. No  oropharyngeal exudate.  Eyes: Conjunctivae are normal. Pupils are equal, round, and reactive to light. Right eye exhibits no discharge. Left eye exhibits no discharge. No scleral icterus.  Neck: Normal range of motion. Neck supple. No thyromegaly present.  Cardiovascular: Normal rate, regular rhythm, normal heart sounds and intact distal pulses.  Exam reveals no gallop and no friction rub.   No murmur heard. Pulmonary/Chest: Effort normal and breath sounds normal. No stridor. No respiratory distress. He has no wheezes. He has no rales. He exhibits no tenderness.  Abdominal: Soft. Bowel sounds are normal. He exhibits no distension. There is tenderness in the right upper quadrant, right lower quadrant and periumbilical area. There is CVA tenderness (R side only), tenderness at McBurney's point and positive Murphy's sign. There is no rebound and no guarding.  Musculoskeletal: He exhibits no edema.  Lymphadenopathy:    He has no cervical adenopathy.  Neurological: He is alert. Coordination normal.  Skin: Skin is warm and dry. No rash noted. He is not diaphoretic. No pallor.  Psychiatric: He has a normal mood and affect.  Nursing note and vitals reviewed.   ED Course  Procedures (including critical care time) Labs Review Labs Reviewed  COMPREHENSIVE METABOLIC PANEL - Abnormal; Notable for the following:    Potassium 3.0 (*)    Total Protein 6.3 (*)    Albumin 3.4 (*)    All other components within normal limits  CBC - Abnormal; Notable for the following:    HCT 38.3 (*)    All other components within normal limits  URINALYSIS, ROUTINE W REFLEX MICROSCOPIC (NOT AT [facility]) - Abnormal; Notable for the following:    Color, Urine AMBER (*)    Specific Gravity, Urine 1.031 (*)    All other components within normal limits  LIPASE, BLOOD  TROPONIN I  MAGNESIUM  OCCULT BLOOD X 1 CARD TO LAB, STOOL  POC OCCULT BLOOD, ED    Imaging Review Dg Chest 2 View  05/17/2016  CLINICAL DATA:  Chest and  abdominal pain since Tuesday, hypertension, smoker EXAM: CHEST  2 VIEW COMPARISON:  01/31/2016 FINDINGS: Normal heart size, mediastinal contours, and pulmonary vascularity. Minimal hyperinflation. No acute infiltrate, pleural effusion or pneumothorax. Faint nodular foci due to EKG leads. No acute osseous findings. No free air under the hemidiaphragms. IMPRESSION: No acute abnormalities. Electronically Signed   By: Ulyses Southward M.D.   On: 05/17/2016 09:55   Ct Abdomen Pelvis W Contrast  05/17/2016  CLINICAL DATA:  Right upper quadrant pain nausea and vomiting starting Tuesday EXAM: CT ABDOMEN AND PELVIS WITH CONTRAST TECHNIQUE: Multidetector CT imaging of the abdomen and pelvis was performed using the standard protocol following bolus administration of intravenous contrast. CONTRAST:  ISOVUE-300 IOPAMIDOL (ISOVUE-300) INJECTION 61% COMPARISON:  None. FINDINGS: Lower chest:  The lung bases are unremarkable. Hepatobiliary: Enhanced liver is unremarkable. No focal mass. A a tiny cyst is noted in right hepatic lobe anteriorly measures 4 mm. Pancreas: Enhanced pancreas is unremarkable. Spleen: Enhanced spleen is unremarkable. Adrenals/Urinary Tract: No adrenal gland mass. Enhanced kidneys are symmetrical in  size. No hydronephrosis or hydroureter. Delayed renal images shows bilateral renal symmetrical excretion. Bilateral visualized proximal ureter is unremarkable. The urinary bladder is empty limiting its assessment. Stomach/Bowel: There is no gastric outlet obstruction. No small bowel obstruction. No thickened or dilated small bowel loops. No pericecal inflammation. Normal appendix is noted in axial image 44. No distal colitis or diverticulitis. Vascular/Lymphatic: No aortic aneurysm. No retroperitoneal or mesenteric adenopathy. No inguinal adenopathy. Reproductive: Prostate gland and seminal vesicles are unremarkable. No pelvic mass is noted. Other: No ascites or free abdominal air. Musculoskeletal: No  destructive bony lesions are noted. Sagittal images of the spine are unremarkable. Minimal degenerative changes with anterior spurring lower thoracic spine. IMPRESSION: 1. There is no evidence of acute inflammatory process within abdomen or pelvis. 2. Normal appendix.  No pericecal inflammation. 3. No hydronephrosis or hydroureter. 4. No small bowel obstruction. 5. Limited assessment of urinary bladder which is empty. Electronically Signed   By: Natasha Mead M.D.   On: 05/17/2016 09:54   I have personally reviewed and evaluated these images and lab results as part of my medical decision-making.   EKG Interpretation   Date/Time:  Friday May 17 2016 08:07:36 EDT Ventricular Rate:  76 PR Interval:    QRS Duration: 85 QT Interval:  453 QTC Calculation: 510 R Axis:   85 Text Interpretation:  Sinus rhythm Consider left atrial enlargement Left  ventricular hypertrophy Prolonged QT interval No significant change since  last tracing Confirmed by NANAVATI, MD, ANKIT [phone]) on 05/17/2016 9:59:10  AM      1125 Patient tolerating fluids without N/V 1153 Patient given crackers, PO challenge, still no N/V  MDM   EKG shows NSR, possible left atrial enlargement, LVH, prolonged QT, but no significant change since last tracing. CBC unremarkable. CMP shows potassium 3, protein 6.3, albumin 3.4. Magnesium 2.0. Lipase 23. Troponin <0.03. UA shows specific gravity 1.031. Fecal occult negative. CXR shows no acute abnormalities. CT abdomen and pelvis shows no evidence of acute inflammatory process; normal appendix and no pericecal inflammation; no hydronephrosis or hydroureter; no small bowel obstruction. Patient able to tolerate fluids and food in ED without nausea or vomiting. Will discharge patient home with Zofran, Carafate. Patient to follow-up with GI for further evaluation. Patient and significant other understand and agree with plan. Strict return precautions discussed. Patient vitals stable throughout ED  course and discharged in satisfactory condition. Patient also evaluated by Dr. Rhunette Croft who is in agreement with plan.  Final diagnoses:  Abdominal pain, unspecified abdominal location  Non-intractable vomiting with nausea, vomiting of unspecified type  Diarrhea, unspecified type        Emi Holes, PA-C 05/17/16 1231  Derwood Kaplan, MD 05/19/16 [id number]

## 2016-05-17 NOTE — ED Notes (Signed)
Pt taken to CT.

## 2016-07-21 ENCOUNTER — Encounter (HOSPITAL_COMMUNITY): Payer: Self-pay

## 2016-07-21 ENCOUNTER — Emergency Department (HOSPITAL_COMMUNITY)
Admission: EM | Admit: 2016-07-21 | Discharge: 2016-07-21 | Disposition: A | Discharge status: Home / Self Care | Payer: Self-pay | Attending: Emergency Medicine | Admitting: Emergency Medicine

## 2016-07-21 ENCOUNTER — Emergency Department (HOSPITAL_COMMUNITY): Payer: Self-pay

## 2016-07-21 DIAGNOSIS — J029 Acute pharyngitis, unspecified: Secondary | ICD-10-CM | POA: Insufficient documentation

## 2016-07-21 DIAGNOSIS — M25561 Pain in right knee: Secondary | ICD-10-CM | POA: Insufficient documentation

## 2016-07-21 DIAGNOSIS — I10 Essential (primary) hypertension: Secondary | ICD-10-CM | POA: Insufficient documentation

## 2016-07-21 DIAGNOSIS — Z79899 Other long term (current) drug therapy: Secondary | ICD-10-CM | POA: Insufficient documentation

## 2016-07-21 DIAGNOSIS — F17210 Nicotine dependence, cigarettes, uncomplicated: Secondary | ICD-10-CM | POA: Insufficient documentation

## 2016-07-21 LAB — RAPID STREP SCREEN (MED CTR MEBANE ONLY): STREPTOCOCCUS, GROUP A SCREEN (DIRECT): NEGATIVE

## 2016-07-21 NOTE — ED Notes (Signed)
Pt to xray

## 2016-07-21 NOTE — ED Provider Notes (Signed)
[facility]-EMERGENCY DEPT Provider Note   CSN: [id number] Arrival date & time: 07/21/16  1809   By signing my name below, I, Christel MormonMatthew Jamison, attest that this documentation has been prepared under the direction and in the presence of Roxy Horsemanobert Haiven Nardone, PA-C. Electronically Signed: Christel MormonMatthew Jamison, Scribe. 07/21/2016. 7:37 PM.   History   Chief Complaint Chief Complaint  Patient presents with  . Leg Pain    The history is provided by the patient. No language interpreter was used.   HPI Comments:  Eric Parsons is a 45 y.o. male who presents to the Emergency Department complaining of constant, radiating pain in his R leg with intermittent swelling x1 week. Pt states that the pain radiates from his R knee to his R ankle. Pt describes the pain as soreness to the back of his R knee. Pt reports associated difficulty extending his R leg and difficulty walking. Pt states that he used Bengay and wrapped his R knee and then noticed swelling and numbness while at work. Pt notes that he traveled to and from [facility] today and does not travel often. Pt denies injury.   Pt also complains of painful "burning" in his throat when swallowing that he first noticed this morning. Pt states that he has never had any similar episodes previously. Pt complains of subjective fever and states that 1 week ago he began having hot flashes and felt like he was going to lose consciousness in the shower. Pt denies sore throat, rhinorrhea.  Past Medical History:  Diagnosis Date  . Hypertension     Patient Active Problem List   Diagnosis Date Noted  . Costochondritis   . Chest pain 01/31/2016  . GERD (gastroesophageal reflux disease) 01/31/2016  . Hypertension 01/31/2016    History reviewed. No pertinent surgical history.     Home Medications    Prior to Admission medications   Medication Sig Start Date End Date Taking? Authorizing Provider  esomeprazole (NEXIUM) 40 MG capsule Take 1 capsule (40 mg total) by mouth  daily. Patient not taking: Reported on 05/17/2016 01/31/16   Pricilla LovelessScott Goldston, MD  famotidine (PEPCID) 20 MG tablet Take 1 tablet (20 mg total) by mouth 2 (two) times daily. Patient not taking: Reported on 05/17/2016 01/31/16   Pricilla LovelessScott Goldston, MD  ibuprofen (ADVIL,MOTRIN) 200 MG tablet Take 400 mg by mouth every 6 (six) hours as needed for mild pain.    Historical Provider, MD  omeprazole (PRILOSEC) 20 MG capsule Take 20 mg by mouth daily as needed (heartburn / indigestion).    Historical Provider, MD  ondansetron (ZOFRAN) 4 MG tablet Take 1 tablet (4 mg total) by mouth every 6 (six) hours. 05/17/16   Emi HolesAlexandra M Law, PA-C  sucralfate (CARAFATE) 1 GM/10ML suspension Take 10 mLs (1 g total) by mouth 4 (four) times daily -  with meals and at bedtime. 05/17/16   Emi HolesAlexandra M Law, PA-C    Family History Family History  Problem Relation Age of Onset  . Hypertension Mother   . Diabetes Mother     Social History Social History  Substance Use Topics  . Smoking status: Current Every Day Smoker    Packs/day: 0.50    Types: Cigarettes  . Smokeless tobacco: Current User    Types: Chew  . Alcohol use Yes     Allergies   Review of patient's allergies indicates no known allergies.   Review of Systems Review of Systems  Constitutional: Positive for fever.  HENT: Positive for trouble swallowing. Negative for rhinorrhea and  sore throat.   Musculoskeletal: Positive for arthralgias, gait problem and joint swelling.  All other systems reviewed and are negative.    Physical Exam Updated Vital Signs BP 147/93 (BP Location: Left Arm)   Pulse 96   Temp 98.8 F (37.1 C) (Oral)   Resp 16   SpO2 100%   Physical Exam  Constitutional: He is oriented to person, place, and time. He appears well-developed and well-nourished.  HENT:  Head: Normocephalic and atraumatic.  Oropharynx mildly erythematous, no tonsillar exudates, no abscess  Eyes: Conjunctivae and EOM are normal. Pupils are equal, round, and  reactive to light. Right eye exhibits no discharge. Left eye exhibits no discharge. No scleral icterus.  Neck: Normal range of motion. Neck supple. No JVD present.  Cardiovascular: Normal rate, regular rhythm and normal heart sounds.  Exam reveals no gallop and no friction rub.   No murmur heard. Pulmonary/Chest: Effort normal and breath sounds normal. No respiratory distress. He has no wheezes. He has no rales. He exhibits no tenderness.  Abdominal: Soft. He exhibits no distension and no mass. There is no tenderness. There is no rebound and no guarding.  Musculoskeletal: Normal range of motion. He exhibits no edema or tenderness.  No swelling or deformity about the right knee, normal range of motion strength, no evidence of DVT or septic joint  Neurological: He is alert and oriented to person, place, and time.  Skin: Skin is warm and dry.  No erythema, cellulitis, or abscess  Psychiatric: He has a normal mood and affect. His behavior is normal. Judgment and thought content normal.  Nursing note and vitals reviewed.    ED Treatments / Results  DIAGNOSTIC STUDIES:  Oxygen Saturation is 100% on RA, normal by my interpretation.    COORDINATION OF CARE:  7:37 PM Discussed treatment plan with pt at bedside and pt agreed to plan.   Labs (all labs ordered are listed, but only abnormal results are displayed) Labs Reviewed - No data to display  EKG  EKG Interpretation None       Radiology No results found.  Procedures Procedures (including critical care time)  Medications Ordered in ED Medications - No data to display   Initial Impression / Assessment and Plan / ED Course  I have reviewed the triage vital signs and the nursing notes.  Pertinent labs & imaging results that were available during my care of the patient were reviewed by me and considered in my medical decision making (see chart for details).  Clinical Course    Patient with 2 complaints. Right knee pain.  Plain films are negative. No swelling about the knee. He has had some radiating symptoms, question whether he has a Baker's cyst. There is no evidence of septic joint. Patient will be given a knee immobilizer and crutches and recommend orthopedic follow-up. Regarding his sore throat, he has had some fevers and chills. Strep test is negative. Likely viral. Recommend supportive care.  Final Clinical Impressions(s) / ED Diagnoses   Final diagnoses:  Right knee pain  Sore throat    New Prescriptions Discharge Medication List as of 07/21/2016  9:10 PM    I personally performed the services described in this documentation, which was scribed in my presence. The recorded information has been reviewed and is accurate.      Roxy Horseman, PA-C 07/22/16 0126    Gilda Crease, MD 07/22/16 [phone]

## 2016-07-21 NOTE — ED Triage Notes (Signed)
Patient complains of right leg pain and intermittent swelling x 1 week, states started after standing on feet all week at work, denies trauma. Minimal to no swelling noted. Also concerned that he feels a burning when he swallows, no sore throat, only when he eats something, NAD

## 2016-07-23 LAB — CULTURE, GROUP A STREP (THRC)

## 2016-08-12 ENCOUNTER — Emergency Department (HOSPITAL_COMMUNITY)
Admission: EM | Admit: 2016-08-12 | Discharge: 2016-08-12 | Disposition: A | Discharge status: Home / Self Care | Payer: Self-pay | Attending: Physician Assistant | Admitting: Physician Assistant

## 2016-08-12 ENCOUNTER — Emergency Department (HOSPITAL_COMMUNITY): Payer: Self-pay

## 2016-08-12 ENCOUNTER — Encounter (HOSPITAL_COMMUNITY): Payer: Self-pay

## 2016-08-12 DIAGNOSIS — F17210 Nicotine dependence, cigarettes, uncomplicated: Secondary | ICD-10-CM | POA: Insufficient documentation

## 2016-08-12 DIAGNOSIS — K219 Gastro-esophageal reflux disease without esophagitis: Secondary | ICD-10-CM | POA: Insufficient documentation

## 2016-08-12 DIAGNOSIS — Z79899 Other long term (current) drug therapy: Secondary | ICD-10-CM | POA: Insufficient documentation

## 2016-08-12 DIAGNOSIS — I10 Essential (primary) hypertension: Secondary | ICD-10-CM | POA: Insufficient documentation

## 2016-08-12 DIAGNOSIS — R079 Chest pain, unspecified: Secondary | ICD-10-CM

## 2016-08-12 LAB — CBC
HCT: 38.1 % — ABNORMAL LOW (ref 39.0–52.0)
HEMOGLOBIN: 12.3 g/dL — AB (ref 13.0–17.0)
MCH: 27.6 pg (ref 26.0–34.0)
MCHC: 32.3 g/dL (ref 30.0–36.0)
MCV: 85.6 fL (ref 78.0–100.0)
Platelets: 235 10*3/uL (ref 150–400)
RBC: 4.45 MIL/uL (ref 4.22–5.81)
RDW: 13.9 % (ref 11.5–15.5)
WBC: 7.4 10*3/uL (ref 4.0–10.5)

## 2016-08-12 LAB — BASIC METABOLIC PANEL
ANION GAP: 7 (ref 5–15)
BUN: 11 mg/dL (ref 6–20)
CALCIUM: 9.1 mg/dL (ref 8.9–10.3)
CO2: 28 mmol/L (ref 22–32)
Chloride: 106 mmol/L (ref 101–111)
Creatinine, Ser: 1.13 mg/dL (ref 0.61–1.24)
Glucose, Bld: 97 mg/dL (ref 65–99)
Potassium: 3.4 mmol/L — ABNORMAL LOW (ref 3.5–5.1)
SODIUM: 141 mmol/L (ref 135–145)

## 2016-08-12 LAB — I-STAT TROPONIN, ED: TROPONIN I, POC: 0 ng/mL (ref 0.00–0.08)

## 2016-08-12 MED ORDER — AMLODIPINE BESYLATE 5 MG PO TABS
5.0000 mg | ORAL_TABLET | Freq: Once | ORAL | Status: AC
  Administered 2016-08-12: 5 mg via ORAL
  Filled 2016-08-12: qty 1

## 2016-08-12 MED ORDER — GI COCKTAIL ~~LOC~~
30.0000 mL | Freq: Once | ORAL | Status: AC
  Administered 2016-08-12: 30 mL via ORAL
  Filled 2016-08-12: qty 30

## 2016-08-12 MED ORDER — ESOMEPRAZOLE MAGNESIUM 40 MG PO CPDR: 40.0000 mg | DELAYED_RELEASE_CAPSULE | Freq: Every day | ORAL | 0 refills | Status: DC

## 2016-08-12 MED ORDER — AMLODIPINE BESYLATE 5 MG PO TABS: 5.0000 mg | ORAL_TABLET | Freq: Every day | ORAL | 0 refills | Status: DC

## 2016-08-12 NOTE — ED Provider Notes (Signed)
[facility] Provider Note   CSN: [id number] Arrival date & time: 08/12/16  0751     History   Chief Complaint Chief Complaint  Patient presents with  . Chest Pain    chest pain    HPI Eric Parsons is a 45 y.o. male.  The history is provided by the patient and medical records.    45 year old male with history of hypertension not currently on medication, presenting to the ED for chest pain. Patient states this began yesterday afternoon around 1 PM and has been persistent throughout the night into this morning.  He states it is a midsternal, burning sensation consistent with his acid reflux. He denies any shortness of breath, palpitations, dizziness, or weakness.  He has no known cardiac history. He is a daily smoker. States he has not been on any hypertensive medications in several months.  States he took it briefly, however it was so long ago he cannot recall what his medications were.  States he was previously taking Nexium for his GERD, has not had that in several months due to financial reasons.  States he does not have a PCP so he usually just comes here when he has an issue.  Past Medical History:  Diagnosis Date  . Hypertension     Patient Active Problem List   Diagnosis Date Noted  . Costochondritis   . Chest pain 01/31/2016  . GERD (gastroesophageal reflux disease) 01/31/2016  . Hypertension 01/31/2016    History reviewed. No pertinent surgical history.     Home Medications    Prior to Admission medications   Medication Sig Start Date End Date Taking? Authorizing Provider  esomeprazole (NEXIUM) 40 MG capsule Take 1 capsule (40 mg total) by mouth daily. Patient not taking: Reported on 05/17/2016 01/31/16   Pricilla Loveless, MD  famotidine (PEPCID) 20 MG tablet Take 1 tablet (20 mg total) by mouth 2 (two) times daily. Patient not taking: Reported on 05/17/2016 01/31/16   Pricilla Loveless, MD  ibuprofen (ADVIL,MOTRIN) 200 MG tablet Take 400 mg by mouth every 6  (six) hours as needed for mild pain.    Historical Provider, MD  omeprazole (PRILOSEC) 20 MG capsule Take 20 mg by mouth daily as needed (heartburn / indigestion).    Historical Provider, MD  ondansetron (ZOFRAN) 4 MG tablet Take 1 tablet (4 mg total) by mouth every 6 (six) hours. 05/17/16   Emi Holes, PA-C  sucralfate (CARAFATE) 1 GM/10ML suspension Take 10 mLs (1 g total) by mouth 4 (four) times daily -  with meals and at bedtime. 05/17/16   Emi Holes, PA-C    Family History Family History  Problem Relation Age of Onset  . Hypertension Mother   . Diabetes Mother     Social History Social History  Substance Use Topics  . Smoking status: Current Every Day Smoker    Packs/day: 0.50    Types: Cigarettes  . Smokeless tobacco: Current User    Types: Chew  . Alcohol use Yes     Allergies   Review of patient's allergies indicates no known allergies.   Review of Systems Review of Systems  Cardiovascular: Positive for chest pain.  All other systems reviewed and are negative.    Physical Exam Updated Vital Signs BP 154/99 (BP Location: Left Arm)   Pulse 72   Temp 97.9 F (36.6 C)   Resp 18   Ht 5\' 10"  (1.778 m)   Wt 68 kg   SpO2 100%   BMI  21.52 kg/m   Physical Exam  Constitutional: He is oriented to person, place, and time. He appears well-developed and well-nourished.  HENT:  Head: Normocephalic and atraumatic.  Mouth/Throat: Oropharynx is clear and moist.  Eyes: Conjunctivae and EOM are normal. Pupils are equal, round, and reactive to light.  Neck: Normal range of motion.  Cardiovascular: Normal rate, regular rhythm and normal heart sounds.   Pulmonary/Chest: Effort normal and breath sounds normal. He has no decreased breath sounds. He has no wheezes.  Chest wall is non-tender, lungs clear bilaterally  Abdominal: Soft. Bowel sounds are normal.  Musculoskeletal: Normal range of motion.  Neurological: He is alert and oriented to person, place, and time.    Skin: Skin is warm and dry.  Psychiatric: He has a normal mood and affect.  Nursing note and vitals reviewed.    ED Treatments / Results  Labs (all labs ordered are listed, but only abnormal results are displayed) Labs Reviewed  BASIC METABOLIC PANEL - Abnormal; Notable for the following:       Result Value   Potassium 3.4 (*)    All other components within normal limits  CBC - Abnormal; Notable for the following:    Hemoglobin 12.3 (*)    HCT 38.1 (*)    All other components within normal limits  I-STAT TROPOININ, ED    EKG  EKG Interpretation  Date/Time:  Monday August 12 2016 07:56:13 EDT Ventricular Rate:  76 PR Interval:  162 QRS Duration: 86 QT Interval:  416 QTC Calculation: 468 R Axis:   87 Text Interpretation:  Sinus rhythm with occasional Premature ventricular complexes Left ventricular hypertrophy with repolarization abnormality Abnormal ECG Confirmed by BELFI  MD, MELANIE ([id number]) on 08/12/2016 8:22:10 AM Also confirmed by BELFI  MD, MELANIE ([id number]), editor [place] CT, Jola Babinski [phone])  on 08/12/2016 8:22:58 AM       Radiology Dg Chest 2 View  Result Date: 08/12/2016 CLINICAL DATA:  Chest pain with shortness of breath EXAM: CHEST  2 VIEW COMPARISON:  May 17, 2016 FINDINGS: There is no edema or consolidation. Heart size and pulmonary vascularity are normal. No adenopathy. No pneumothorax. No bone lesions. IMPRESSION: No edema or consolidation. Electronically Signed   By: Bretta Bang III M.D.   On: 08/12/2016 08:39    Procedures Procedures (including critical care time)  Medications Ordered in ED Medications  gi cocktail (Maalox,Lidocaine,Donnatal) (30 mLs Oral Given 08/12/16 0953)  amLODipine (NORVASC) tablet 5 mg (5 mg Oral Given 08/12/16 0959)     Initial Impression / Assessment and Plan / ED Course  I have reviewed the triage vital signs and the nursing notes.  Pertinent labs & imaging results that were available during my care of the patient  were reviewed by me and considered in my medical decision making (see chart for details).  Clinical Course   45 year old male here with chest pain. States it feels like his usual GERD. Began yesterday at 1 PM and has been persistent since that time. No known cardiac history. EKG here is nonischemic. Labwork is reassuring. Chest x-ray is clear. His pain fully resolved after GI cocktail. He was initially hypertensive, it appears that he was previously on Norvasc. He was given dose of this year with improvement of his blood pressure. He remains asymptomatic of this. There are no signs of end organ damage today.  Given negative work-up here today after 12+ hours of symptoms and good resolution of symptoms with GI cocktail, do feel that this is likely GERD  related.  Lower suspicion for ACS, PE, dissection, or other acute cardiac death. Patient will be discharged home with refills of his medication. He does not currently have a PCP or insurance, was given referral to the [facility] for follow-up.  Discussed plan with patient and wife, they both acknowledged understanding and agreed with plan of care.  Return precautions given for new or worsening symptoms.  Final Clinical Impressions(s) / ED Diagnoses   Final diagnoses:  Chest pain, unspecified type  Gastroesophageal reflux disease without esophagitis  Hypertension, unspecified type    New Prescriptions Discharge Medication List as of 08/12/2016 12:03 PM    START taking these medications   Details  amLODipine (NORVASC) 5 MG tablet Take 1 tablet (5 mg total) by mouth daily., Starting Mon 08/12/2016, Print         Garlon HatchetLisa M Karriem Muench, PA-C 08/12/16 1235    Courteney Lyn Mackuen, MD 08/13/16 1016

## 2016-08-12 NOTE — ED Triage Notes (Signed)
Pt states that he has chest pain that started last night pt has been seen here multiple times for chest pain

## 2016-08-12 NOTE — Discharge Instructions (Signed)
Take the prescribed medication as directed. [facility]-- call there today to make an appt. Return to the ED for new or worsening symptoms.

## 2016-08-19 ENCOUNTER — Encounter (HOSPITAL_COMMUNITY): Payer: Self-pay | Admitting: *Deleted

## 2016-08-19 DIAGNOSIS — I10 Essential (primary) hypertension: Secondary | ICD-10-CM | POA: Insufficient documentation

## 2016-08-19 DIAGNOSIS — F17210 Nicotine dependence, cigarettes, uncomplicated: Secondary | ICD-10-CM | POA: Insufficient documentation

## 2016-08-19 DIAGNOSIS — K2900 Acute gastritis without bleeding: Secondary | ICD-10-CM | POA: Insufficient documentation

## 2016-08-19 LAB — CBC WITH DIFFERENTIAL/PLATELET
Basophils Absolute: 0 10*3/uL (ref 0.0–0.1)
Basophils Relative: 0 %
EOS PCT: 0 %
Eosinophils Absolute: 0 10*3/uL (ref 0.0–0.7)
HCT: 39.4 % (ref 39.0–52.0)
Hemoglobin: 13.5 g/dL (ref 13.0–17.0)
LYMPHS ABS: 2.8 10*3/uL (ref 0.7–4.0)
LYMPHS PCT: 27 %
MCH: 28.2 pg (ref 26.0–34.0)
MCHC: 34.3 g/dL (ref 30.0–36.0)
MCV: 82.4 fL (ref 78.0–100.0)
MONO ABS: 0.8 10*3/uL (ref 0.1–1.0)
MONOS PCT: 8 %
Neutro Abs: 7 10*3/uL (ref 1.7–7.7)
Neutrophils Relative %: 65 %
PLATELETS: 260 10*3/uL (ref 150–400)
RBC: 4.78 MIL/uL (ref 4.22–5.81)
RDW: 13.7 % (ref 11.5–15.5)
WBC: 10.7 10*3/uL — ABNORMAL HIGH (ref 4.0–10.5)

## 2016-08-19 LAB — URINE MICROSCOPIC-ADD ON

## 2016-08-19 LAB — URINALYSIS, ROUTINE W REFLEX MICROSCOPIC
Glucose, UA: NEGATIVE mg/dL
Hgb urine dipstick: NEGATIVE
Ketones, ur: 15 mg/dL — AB
Leukocytes, UA: NEGATIVE
NITRITE: NEGATIVE
PROTEIN: 30 mg/dL — AB
SPECIFIC GRAVITY, URINE: 1.029 (ref 1.005–1.030)
pH: 8 (ref 5.0–8.0)

## 2016-08-19 LAB — COMPREHENSIVE METABOLIC PANEL
ALT: 18 U/L (ref 17–63)
AST: 24 U/L (ref 15–41)
Albumin: 4.1 g/dL (ref 3.5–5.0)
Alkaline Phosphatase: 45 U/L (ref 38–126)
Anion gap: 11 (ref 5–15)
BILIRUBIN TOTAL: 1 mg/dL (ref 0.3–1.2)
BUN: 10 mg/dL (ref 6–20)
CHLORIDE: 100 mmol/L — AB (ref 101–111)
CO2: 26 mmol/L (ref 22–32)
Calcium: 9.7 mg/dL (ref 8.9–10.3)
Creatinine, Ser: 1.2 mg/dL (ref 0.61–1.24)
Glucose, Bld: 100 mg/dL — ABNORMAL HIGH (ref 65–99)
POTASSIUM: 3.5 mmol/L (ref 3.5–5.1)
Sodium: 137 mmol/L (ref 135–145)
TOTAL PROTEIN: 7.3 g/dL (ref 6.5–8.1)

## 2016-08-19 LAB — LIPASE, BLOOD: LIPASE: 27 U/L (ref 11–51)

## 2016-08-19 MED ORDER — ONDANSETRON 4 MG PO TBDP
ORAL_TABLET | ORAL | Status: AC
  Administered 2016-08-19: 4 mg
  Filled 2016-08-19: qty 1

## 2016-08-19 MED ORDER — ONDANSETRON 4 MG PO TBDP: 4.0000 mg | ORAL_TABLET | Freq: Once | ORAL | Status: DC

## 2016-08-19 NOTE — ED Triage Notes (Signed)
Patient presents with c/o abd pain for several days.  Seen for the same 4 days ago.

## 2016-08-20 ENCOUNTER — Emergency Department (HOSPITAL_COMMUNITY)
Admission: EM | Admit: 2016-08-20 | Discharge: 2016-08-20 | Disposition: A | Discharge status: Home / Self Care | Payer: Self-pay | Attending: Emergency Medicine | Admitting: Emergency Medicine

## 2016-08-20 DIAGNOSIS — K2900 Acute gastritis without bleeding: Secondary | ICD-10-CM

## 2016-08-20 MED ORDER — SODIUM CHLORIDE 0.9 % IV BOLUS (SEPSIS)
1000.0000 mL | Freq: Once | INTRAVENOUS | Status: AC
  Administered 2016-08-20: 1000 mL via INTRAVENOUS

## 2016-08-20 MED ORDER — GI COCKTAIL ~~LOC~~
30.0000 mL | Freq: Once | ORAL | Status: AC
  Administered 2016-08-20: 30 mL via ORAL
  Filled 2016-08-20: qty 30

## 2016-08-20 MED ORDER — HYDROMORPHONE HCL 1 MG/ML IJ SOLN
1.0000 mg | Freq: Once | INTRAMUSCULAR | Status: AC
  Administered 2016-08-20: 1 mg via INTRAVENOUS
  Filled 2016-08-20: qty 1

## 2016-08-20 MED ORDER — ONDANSETRON HCL 4 MG/2ML IJ SOLN
4.0000 mg | Freq: Once | INTRAMUSCULAR | Status: AC
  Administered 2016-08-20: 4 mg via INTRAVENOUS
  Filled 2016-08-20: qty 2

## 2016-08-20 MED ORDER — SUCRALFATE 1 GM/10ML PO SUSP: 1.0000 g | Freq: Three times a day (TID) | ORAL | 0 refills | Status: DC

## 2016-08-20 MED ORDER — RANITIDINE HCL 150 MG PO TABS: 150.0000 mg | ORAL_TABLET | Freq: Two times a day (BID) | ORAL | 0 refills | Status: DC

## 2016-08-20 NOTE — ED Provider Notes (Signed)
[facility] Provider Note   CSN: [id number] Arrival date & time: 08/19/16  2041  By signing my name below, I, Rosario Adie, attest that this documentation has been prepared under the direction and in the presence of Gilda Crease, MD. Electronically Signed: Rosario Adie, ED Scribe. 08/20/16. 2:33 AM.  History   Chief Complaint Chief Complaint  Patient presents with  . Abdominal Pain  . Emesis   The history is provided by the patient and medical records. No language interpreter was used.   HPI Comments: Eric Parsons is a 45 y.o. male with a PMHx of GERD and HTN, who presents to the Emergency Department complaining of gradual onset, constant epigastric abdominal pain onset several day ago. Pt reports associated intermittent nausea and vomiting secondary to his abdominal pain. Pt has been seen for same in the ED several times over the past ~6 months for same, most recently ~4 days ago. Prior chart review reveals that all of his previous workups, including a CT A/P, have all been otherwise unremarkable. No alleviating or exacerbating factors noted. He is not currently followed by a Gastroenterologist. Denies bloody stools, fever, diarrhea, or any other associated symptoms.   Past Medical History:  Diagnosis Date  . Hypertension    Patient Active Problem List   Diagnosis Date Noted  . Costochondritis   . Chest pain 01/31/2016  . GERD (gastroesophageal reflux disease) 01/31/2016  . Hypertension 01/31/2016   History reviewed. No pertinent surgical history.  Home Medications    Prior to Admission medications   Medication Sig Start Date End Date Taking? Authorizing Provider  ibuprofen (ADVIL,MOTRIN) 200 MG tablet Take 400 mg by mouth every 6 (six) hours as needed for mild pain.   Yes Historical Provider, MD  amLODipine (NORVASC) 5 MG tablet Take 1 tablet (5 mg total) by mouth daily. Patient not taking: Reported on 08/19/2016 08/12/16   Garlon Hatchet,  PA-C  esomeprazole (NEXIUM) 40 MG capsule Take 1 capsule (40 mg total) by mouth daily. Patient not taking: Reported on 08/19/2016 08/12/16   Garlon Hatchet, PA-C  famotidine (PEPCID) 20 MG tablet Take 1 tablet (20 mg total) by mouth 2 (two) times daily. Patient not taking: Reported on 08/19/2016 01/31/16   Pricilla Loveless, MD  ondansetron (ZOFRAN) 4 MG tablet Take 1 tablet (4 mg total) by mouth every 6 (six) hours. Patient not taking: Reported on 08/19/2016 05/17/16   Waylan Boga Law, PA-C  sucralfate (CARAFATE) 1 GM/10ML suspension Take 10 mLs (1 g total) by mouth 4 (four) times daily -  with meals and at bedtime. Patient not taking: Reported on 08/19/2016 05/17/16   Emi Holes, PA-C   Family History Family History  Problem Relation Age of Onset  . Hypertension Mother   . Diabetes Mother    Social History Social History  Substance Use Topics  . Smoking status: Current Every Day Smoker    Packs/day: 0.50    Types: Cigarettes  . Smokeless tobacco: Current User    Types: Chew  . Alcohol use Yes   Allergies   Review of patient's allergies indicates no known allergies.  Review of Systems Review of Systems  Constitutional: Negative for fever.  Gastrointestinal: Positive for abdominal pain, nausea and vomiting. Negative for blood in stool and diarrhea.  All other systems reviewed and are negative.  Physical Exam Updated Vital Signs BP (!) 160/115   Pulse 73   Temp 98.1 F (36.7 C) (Oral)   Resp 19  Wt 150 lb (68 kg)   SpO2 100%   BMI 21.52 kg/m   Physical Exam  Constitutional: He is oriented to person, place, and time. He appears well-developed and well-nourished. No distress.  HENT:  Head: Normocephalic and atraumatic.  Right Ear: Hearing normal.  Left Ear: Hearing normal.  Nose: Nose normal.  Mouth/Throat: Oropharynx is clear and moist and mucous membranes are normal.  Eyes: Conjunctivae and EOM are normal. Pupils are equal, round, and reactive to light.  Neck:  Normal range of motion. Neck supple.  Cardiovascular: Regular rhythm, S1 normal and S2 normal.  Exam reveals no gallop and no friction rub.   No murmur heard. Pulmonary/Chest: Effort normal and breath sounds normal. No respiratory distress. He exhibits no tenderness.  Abdominal: Soft. Normal appearance and bowel sounds are normal. There is no hepatosplenomegaly. There is no tenderness. There is no rebound, no guarding, no tenderness at McBurney's point and negative Murphy's sign. No hernia.  Musculoskeletal: Normal range of motion.  Neurological: He is alert and oriented to person, place, and time. He has normal strength. No cranial nerve deficit or sensory deficit. Coordination normal. GCS eye subscore is 4. GCS verbal subscore is 5. GCS motor subscore is 6.  Skin: Skin is warm, dry and intact. No rash noted. No cyanosis.  Psychiatric: He has a normal mood and affect. His speech is normal and behavior is normal. Thought content normal.  Nursing note and vitals reviewed.  ED Treatments / Results  DIAGNOSTIC STUDIES: Oxygen Saturation is 100% on RA, normal by my interpretation.   COORDINATION OF CARE: 2:33 AM-Discussed next steps with pt. Pt verbalized understanding and is agreeable with the plan.   Labs (all labs ordered are listed, but only abnormal results are displayed) Labs Reviewed  CBC WITH DIFFERENTIAL/PLATELET - Abnormal; Notable for the following:       Result Value   WBC 10.7 (*)    All other components within normal limits  COMPREHENSIVE METABOLIC PANEL - Abnormal; Notable for the following:    Chloride 100 (*)    Glucose, Bld 100 (*)    All other components within normal limits  URINALYSIS, ROUTINE W REFLEX MICROSCOPIC (NOT AT [facility]) - Abnormal; Notable for the following:    Color, Urine RED (*)    Bilirubin Urine SMALL (*)    Ketones, ur 15 (*)    Protein, ur 30 (*)    All other components within normal limits  URINE MICROSCOPIC-ADD ON - Abnormal; Notable for the  following:    Squamous Epithelial / LPF 0-5 (*)    Bacteria, UA RARE (*)    All other components within normal limits  LIPASE, BLOOD    EKG  EKG Interpretation None       Radiology No results found.  Procedures Procedures (including critical care time)  Medications Ordered in ED Medications  ondansetron (ZOFRAN-ODT) disintegrating tablet 4 mg (4 mg Oral Not Given 08/19/16 2056)  ondansetron (ZOFRAN-ODT) 4 MG disintegrating tablet (4 mg  Given 08/19/16 2056)  sodium chloride 0.9 % bolus 1,000 mL (0 mLs Intravenous Stopped 08/20/16 0407)  HYDROmorphone (DILAUDID) injection 1 mg (1 mg Intravenous Given 08/20/16 0242)  ondansetron (ZOFRAN) injection 4 mg (4 mg Intravenous Given 08/20/16 0242)  gi cocktail (Maalox,Lidocaine,Donnatal) (30 mLs Oral Given 08/20/16 0242)     Initial Impression / Assessment and Plan / ED Course  I have reviewed the triage vital signs and the nursing notes.  Pertinent labs & imaging results that were available during my  care of the patient were reviewed by me and considered in my medical decision making (see chart for details).  Clinical Course    Patient presents to the ER for evaluation of abdominal pain. Patient has recurrent epigastric pain has been ongoing for some time. It was accompanied by nausea and vomiting. He has not had hematemesis or melanotic stools. Blood work including hemoglobin is normal. LFTs are normal. Examination reveals mild epigastric tenderness without Murphy's sign or right upper quadrant tenderness. No lower abdominal or pelvic tenderness. Negative Murphy's tenderness.  He has had multiple workups in the past including CT scan for this. I do not believe he requires additional imaging. He is pain-free after treatment here in the ER. Presentation most consistent with gastritis, cannot rule out peptic ulcer disease. Will refer to gastroenterology on call.  Final Clinical Impressions(s) / ED Diagnoses   Final diagnoses:    Acute gastritis without hemorrhage, unspecified gastritis type    New Prescriptions New Prescriptions   No medications on file   I personally performed the services described in this documentation, which was scribed in my presence. The recorded information has been reviewed and is accurate.     Gilda Crease, MD 08/20/16 [phone]

## 2016-08-20 NOTE — ED Notes (Signed)
Pt has epigastric pain "ongoing for years(per family member)." Pt has had NV, denies diarrhea. Also reports belching causes worsening, burning pain.

## 2016-09-17 ENCOUNTER — Emergency Department (HOSPITAL_COMMUNITY)
Admission: EM | Admit: 2016-09-17 | Discharge: 2016-09-18 | Disposition: A | Discharge status: Home / Self Care | Payer: Self-pay | Attending: Emergency Medicine | Admitting: Emergency Medicine

## 2016-09-17 ENCOUNTER — Encounter (HOSPITAL_COMMUNITY): Payer: Self-pay

## 2016-09-17 DIAGNOSIS — F17210 Nicotine dependence, cigarettes, uncomplicated: Secondary | ICD-10-CM | POA: Insufficient documentation

## 2016-09-17 DIAGNOSIS — R1013 Epigastric pain: Secondary | ICD-10-CM | POA: Insufficient documentation

## 2016-09-17 DIAGNOSIS — I10 Essential (primary) hypertension: Secondary | ICD-10-CM | POA: Insufficient documentation

## 2016-09-17 LAB — LIPASE, BLOOD: Lipase: 87 U/L — ABNORMAL HIGH (ref 11–51)

## 2016-09-17 LAB — URINALYSIS, ROUTINE W REFLEX MICROSCOPIC
GLUCOSE, UA: NEGATIVE mg/dL
KETONES UR: NEGATIVE mg/dL
Leukocytes, UA: NEGATIVE
NITRITE: NEGATIVE
PH: 6 (ref 5.0–8.0)
Protein, ur: NEGATIVE mg/dL
SPECIFIC GRAVITY, URINE: 1.035 — AB (ref 1.005–1.030)

## 2016-09-17 LAB — CBC
HEMATOCRIT: 42.8 % (ref 39.0–52.0)
Hemoglobin: 14.5 g/dL (ref 13.0–17.0)
MCH: 28 pg (ref 26.0–34.0)
MCHC: 33.9 g/dL (ref 30.0–36.0)
MCV: 82.8 fL (ref 78.0–100.0)
Platelets: 296 10*3/uL (ref 150–400)
RBC: 5.17 MIL/uL (ref 4.22–5.81)
RDW: 13.4 % (ref 11.5–15.5)
WBC: 7.9 10*3/uL (ref 4.0–10.5)

## 2016-09-17 LAB — COMPREHENSIVE METABOLIC PANEL
ALBUMIN: 4 g/dL (ref 3.5–5.0)
ALK PHOS: 47 U/L (ref 38–126)
ALT: 20 U/L (ref 17–63)
AST: 22 U/L (ref 15–41)
Anion gap: 10 (ref 5–15)
BILIRUBIN TOTAL: 0.5 mg/dL (ref 0.3–1.2)
BUN: 10 mg/dL (ref 6–20)
CO2: 26 mmol/L (ref 22–32)
Calcium: 9.7 mg/dL (ref 8.9–10.3)
Chloride: 103 mmol/L (ref 101–111)
Creatinine, Ser: 1.18 mg/dL (ref 0.61–1.24)
GFR calc Af Amer: >60 mL/min (ref >60)
GFR calc non Af Amer: >60 mL/min (ref >60)
GLUCOSE: 115 mg/dL — AB (ref 65–99)
POTASSIUM: 3.2 mmol/L — AB (ref 3.5–5.1)
SODIUM: 139 mmol/L (ref 135–145)
TOTAL PROTEIN: 7.5 g/dL (ref 6.5–8.1)

## 2016-09-17 LAB — URINE MICROSCOPIC-ADD ON

## 2016-09-17 MED ORDER — HALOPERIDOL LACTATE 5 MG/ML IJ SOLN
2.0000 mg | Freq: Once | INTRAMUSCULAR | Status: AC
  Administered 2016-09-17: 2 mg via INTRAVENOUS
  Filled 2016-09-17: qty 1

## 2016-09-17 MED ORDER — GI COCKTAIL ~~LOC~~
30.0000 mL | Freq: Once | ORAL | Status: AC
  Administered 2016-09-17: 30 mL via ORAL
  Filled 2016-09-17: qty 30

## 2016-09-17 MED ORDER — ONDANSETRON 4 MG PO TBDP
4.0000 mg | ORAL_TABLET | Freq: Once | ORAL | Status: AC | PRN
  Administered 2016-09-17: 4 mg via ORAL

## 2016-09-17 MED ORDER — ONDANSETRON 4 MG PO TBDP
ORAL_TABLET | ORAL | Status: AC
  Filled 2016-09-17: qty 1

## 2016-09-17 MED ORDER — SODIUM CHLORIDE 0.9 % IV BOLUS (SEPSIS)
1000.0000 mL | Freq: Once | INTRAVENOUS | Status: AC
  Administered 2016-09-17: 1000 mL via INTRAVENOUS

## 2016-09-17 NOTE — ED Notes (Signed)
Called pt name three times for vitals. N/A.

## 2016-09-17 NOTE — ED Triage Notes (Signed)
Pt reports abd pain that began 3 days ago. He has hx of same. Pt also reports n/v.

## 2016-09-17 NOTE — ED Provider Notes (Signed)
[facility] Provider Note  CSN: [id number] Arrival date & time: 09/17/16  1751  History   Chief Complaint Chief Complaint  Patient presents with  . Abdominal Pain    HPI Eric Parsons is a 45 y.o. male.   Abdominal Pain   This is a new problem. The problem occurs constantly. The problem has not changed since onset.The pain is at a severity of 6/10. The pain is moderate. Associated symptoms include melena, nausea and vomiting. Pertinent negatives include fever, hematochezia and constipation.   Past Medical History:  Diagnosis Date  . Hypertension    Patient Active Problem List   Diagnosis Date Noted  . Costochondritis   . Chest pain 01/31/2016  . GERD (gastroesophageal reflux disease) 01/31/2016  . Hypertension 01/31/2016   History reviewed. No pertinent surgical history.   Home Medications    Prior to Admission medications   Medication Sig Start Date End Date Taking? Authorizing Provider  ranitidine (ZANTAC) 150 MG tablet Take 1 tablet (150 mg total) by mouth 2 (two) times daily. Patient not taking: Reported on 09/17/2016 08/20/16   Gilda Creasehristopher J Pollina, MD  sucralfate (CARAFATE) 1 GM/10ML suspension Take 10 mLs (1 g total) by mouth 4 (four) times daily -  with meals and at bedtime. Patient not taking: Reported on 09/17/2016 08/20/16   Gilda Creasehristopher J Pollina, MD    Family History Family History  Problem Relation Age of Onset  . Hypertension Mother   . Diabetes Mother     Social History Social History  Substance Use Topics  . Smoking status: Current Every Day Smoker    Packs/day: 0.50    Types: Cigarettes  . Smokeless tobacco: Current User    Types: Chew  . Alcohol use Yes     Allergies   Patient has no known allergies.   Review of Systems Review of Systems  Constitutional: Negative for fatigue and fever.  Respiratory: Negative for shortness of breath.   Cardiovascular: Negative for chest pain.  Gastrointestinal: Positive for abdominal  pain, melena, nausea and vomiting. Negative for constipation and hematochezia.  Genitourinary: Negative for flank pain, testicular pain and urgency.  All other systems reviewed and are negative.  Physical Exam Updated Vital Signs BP 134/82 (BP Location: Left Arm)   Pulse (!) 53   Temp 98.4 F (36.9 C) (Oral)   Resp 16   SpO2 100%   Physical Exam  Constitutional: He is oriented to person, place, and time. He appears well-developed and well-nourished.  HENT:  Head: Normocephalic.  Eyes: EOM are normal. Pupils are equal, round, and reactive to light.  Neck: Normal range of motion. Neck supple.  Cardiovascular: Normal rate and regular rhythm.   Pulmonary/Chest: Effort normal. No respiratory distress. He has no wheezes.  Abdominal: Soft. He exhibits no distension and no mass. There is tenderness. There is no rebound and no guarding. No hernia.  Epigastric pain without rebound or guarding.   Musculoskeletal: Normal range of motion. He exhibits no edema.  Neurological: He is oriented to person, place, and time. No cranial nerve deficit. He exhibits normal muscle tone. Coordination normal.  Skin: Skin is warm. Capillary refill takes less than 2 seconds.  Psychiatric: He has a normal mood and affect. His behavior is normal. Judgment and thought content normal.  Nursing note and vitals reviewed.    ED Treatments / Results  Labs (all labs ordered are listed, but only abnormal results are displayed) Labs Reviewed  LIPASE, BLOOD - Abnormal; Notable for the following:  Result Value   Lipase 87 (*)    All other components within normal limits  COMPREHENSIVE METABOLIC PANEL - Abnormal; Notable for the following:    Potassium 3.2 (*)    Glucose, Bld 115 (*)    All other components within normal limits  URINALYSIS, ROUTINE W REFLEX MICROSCOPIC (NOT AT [facility]) - Abnormal; Notable for the following:    Color, Urine AMBER (*)    Specific Gravity, Urine 1.035 (*)    Hgb urine dipstick  MODERATE (*)    Bilirubin Urine SMALL (*)    All other components within normal limits  URINE MICROSCOPIC-ADD ON - Abnormal; Notable for the following:    Squamous Epithelial / LPF 0-5 (*)    Bacteria, UA RARE (*)    All other components within normal limits  CBC    EKG  EKG Interpretation None       Radiology No results found.  Procedures Procedures (including critical care time)  Medications Ordered in ED Medications  ondansetron (ZOFRAN-ODT) 4 MG disintegrating tablet (not administered)  ondansetron (ZOFRAN-ODT) disintegrating tablet 4 mg (4 mg Oral Given 09/17/16 1825)     Initial Impression / Assessment and Plan / ED Course  I have reviewed the triage vital signs and the nursing notes.  Pertinent labs & imaging results that were available during my care of the patient were reviewed by me and considered in my medical decision making (see chart for details).  Clinical Course    Patient is a pleasant 45 year old male who presents to emergency department today with epigastric abdominal pain with a history of multiple episodes of the same for the past 4-5 years. Patient states that he is unable to follow up with GI as previously instructed due to financial reasons.  Patient endorses intermittent epigastric abdominal pain is associated with nausea, vomiting and described as cramping in nature. Patient has no diarrhea, no blood in the stool.  Denies any testicular swelling, dysuria or other GI or GU complaints.  Patient's labs unremarkable other than trivial lipase elevation. Patient endorses some alcohol use but not excessive. He also endorses daily marijuana use.   Possible gastritis vs cyclical vomiting vs PUD vs beginning pancreatitis. Advised of patient to stop drinking alcohol, smoking marijuana and for red flags to watch for.   Patient's exam reassuring with mild tenderness and no rebound.   Haldol, fluids and GI cocktail given with resolution of pain.    Patient encouraged to follow up with GI and discharged home with his wife in good health. Financial resource guide given and patient discharged home in good health.    Final Clinical Impressions(s) / ED Diagnoses   Final diagnoses:  Epigastric abdominal pain      Deirdre PeerJeremiah Milani Lowenstein, MD 09/18/16 0107    Gerhard Munchobert Lockwood, MD 09/21/16 [id number]    Gerhard Munchobert Lockwood, MD 09/21/16 2142

## 2016-09-17 NOTE — ED Notes (Signed)
Patient states he is unable to provide an urine sample at this time

## 2017-01-16 ENCOUNTER — Encounter (HOSPITAL_COMMUNITY): Payer: Self-pay | Admitting: *Deleted

## 2017-01-16 ENCOUNTER — Emergency Department (HOSPITAL_COMMUNITY)
Admission: EM | Admit: 2017-01-16 | Discharge: 2017-01-16 | Disposition: A | Discharge status: Home / Self Care | Payer: BLUE CROSS/BLUE SHIELD | Attending: Physician Assistant | Admitting: Physician Assistant

## 2017-01-16 DIAGNOSIS — Z79899 Other long term (current) drug therapy: Secondary | ICD-10-CM | POA: Diagnosis not present

## 2017-01-16 DIAGNOSIS — R110 Nausea: Secondary | ICD-10-CM | POA: Insufficient documentation

## 2017-01-16 DIAGNOSIS — F17210 Nicotine dependence, cigarettes, uncomplicated: Secondary | ICD-10-CM | POA: Diagnosis not present

## 2017-01-16 DIAGNOSIS — I10 Essential (primary) hypertension: Secondary | ICD-10-CM | POA: Insufficient documentation

## 2017-01-16 DIAGNOSIS — R42 Dizziness and giddiness: Secondary | ICD-10-CM | POA: Insufficient documentation

## 2017-01-16 MED ORDER — ONDANSETRON 4 MG PO TBDP: 4.0000 mg | ORAL_TABLET | Freq: Three times a day (TID) | ORAL | 0 refills | Status: DC | PRN

## 2017-01-16 MED ORDER — ONDANSETRON 4 MG PO TBDP
4.0000 mg | ORAL_TABLET | Freq: Once | ORAL | Status: AC
  Administered 2017-01-16: 4 mg via ORAL
  Filled 2017-01-16: qty 1

## 2017-01-16 NOTE — ED Provider Notes (Signed)
[facility] Provider Note   CSN: [id number] Arrival date & time: 01/16/17  [id number]     History   Chief Complaint Chief Complaint  Patient presents with  . Dizziness  . Nausea    HPI Eric Parsons is a 46 y.o. male.  HPI   She is a 46 year old male presenting with viral-like symptoms. Patient states that he woke up this morning at 4 AM for work and felt a little bit fatigued, a little bit dizzy, little bit nauseous. At work he continued to feel a same. They told to go home but required him to get a work note. He presented to an outside clinic. They were going to charge $115 so he came here to the emergency department.  Patient has mild nausea. No abdominal pain. Patient ate normally this morning. Patient has not been to primary care physician but does know that he has hypertension and needs a follow-up.  Past Medical History:  Diagnosis Date  . Hypertension     Patient Active Problem List   Diagnosis Date Noted  . Costochondritis   . Chest pain 01/31/2016  . GERD (gastroesophageal reflux disease) 01/31/2016  . Hypertension 01/31/2016    History reviewed. No pertinent surgical history.     Home Medications    Prior to Admission medications   Medication Sig Start Date End Date Taking? Authorizing Provider  ranitidine (ZANTAC) 150 MG tablet Take 1 tablet (150 mg total) by mouth 2 (two) times daily. Patient not taking: Reported on 09/17/2016 08/20/16   Gilda Creasehristopher J Pollina, MD  sucralfate (CARAFATE) 1 GM/10ML suspension Take 10 mLs (1 g total) by mouth 4 (four) times daily -  with meals and at bedtime. Patient not taking: Reported on 09/17/2016 08/20/16   Gilda Creasehristopher J Pollina, MD    Family History Family History  Problem Relation Age of Onset  . Hypertension Mother   . Diabetes Mother     Social History Social History  Substance Use Topics  . Smoking status: Current Every Day Smoker    Packs/day: 0.50    Types: Cigarettes  . Smokeless tobacco:  Current User    Types: Chew  . Alcohol use Yes     Allergies   Patient has no known allergies.   Review of Systems Review of Systems  Constitutional: Positive for fatigue. Negative for activity change and fever.  Respiratory: Negative for shortness of breath.   Cardiovascular: Negative for chest pain.  Gastrointestinal: Positive for nausea.  Neurological: Positive for dizziness and light-headedness.  All other systems reviewed and are negative.    Physical Exam Updated Vital Signs BP 137/99 (BP Location: Right Arm)   Pulse 81   Temp 98 F (36.7 C) (Oral)   Resp 16   Ht 5\' 10"  (1.778 m)   Wt 150 lb (68 kg)   SpO2 99%   BMI 21.52 kg/m   Physical Exam  Constitutional: He is oriented to person, place, and time. He appears well-nourished. No distress.  HENT:  Head: Normocephalic and atraumatic.  Eyes: Conjunctivae and EOM are normal.  Cardiovascular: Normal rate and regular rhythm.   No murmur heard. Pulmonary/Chest: Effort normal and breath sounds normal. No respiratory distress.  Abdominal: Soft. There is no tenderness.  Neurological: He is oriented to person, place, and time. No cranial nerve deficit.  Skin: Skin is warm and dry. He is not diaphoretic.  Psychiatric: He has a normal mood and affect. His behavior is normal.     ED Treatments / Results  Labs (all labs ordered are listed, but only abnormal results are displayed) Labs Reviewed - No data to display  EKG  EKG Interpretation None       Radiology No results found.  Procedures Procedures (including critical care time)  Medications Ordered in ED Medications  ondansetron (ZOFRAN-ODT) disintegrating tablet 4 mg (not administered)     Initial Impression / Assessment and Plan / ED Course  I have reviewed the triage vital signs and the nursing notes.  Pertinent labs & imaging results that were available during my care of the patient were reviewed by me and considered in my medical decision  making (see chart for details).     Well appearing 46 year old male presenting with viral-like symptoms. Patient has had mild nausea, mild dizziness upon standing. Patient has no cranial nerve deficit. No chest pain. Has normal vital signs except for mild hypertension. We will give patient Zofran, make sure the patient is able to take by mouth. I think this likely viral illness. We'll give him work note and instructions follow-up with a PCP for his hypertension.   Final Clinical Impressions(s) / ED Diagnoses   Final diagnoses:  None    New Prescriptions New Prescriptions   No medications on file     Kaylob Wallen Randall An, MD 01/16/17 [phone]

## 2017-01-16 NOTE — ED Notes (Signed)
Water given to encourage fluids

## 2017-01-16 NOTE — ED Triage Notes (Signed)
Pt was at work, Began to feel dizzy, light headed and having nausea.  Supervisor advised him to come to ER.

## 2017-01-16 NOTE — Discharge Instructions (Signed)
Please return with any chest pain or concerning symtpoms.  We think you are liekly a touch ill, casuign your nausea and dizziness. Please take the pills to help anf follow up with your doctor about your hypertension!  To find a primary care or specialty doctor please call [phone] or [phone] to access "[facility] Find a Doctor Service."  You may also go on the [facility] website at [email or web address]  There are also multiple [facility], [place] [facility] and [facility] practices throughout the [place] that are frequently accepting new patients. You may find a clinic that is close to your home and contact them.  [facility] -  [address] [place] [id number] [phone]  [facility] in [place] (also locations in [place] and [place]) -  [address] [place] [address] [phone]  [facility] -  [address] [place] [address] [phone]

## 2017-04-16 IMAGING — CR DG CHEST 2V
2 series · 2 of 2 positions shown · non-contrast
Comparison: May 17, 2016

CLINICAL DATA: Chest pain with shortness of breath

EXAM:
CHEST  2 VIEW

[chest pa]
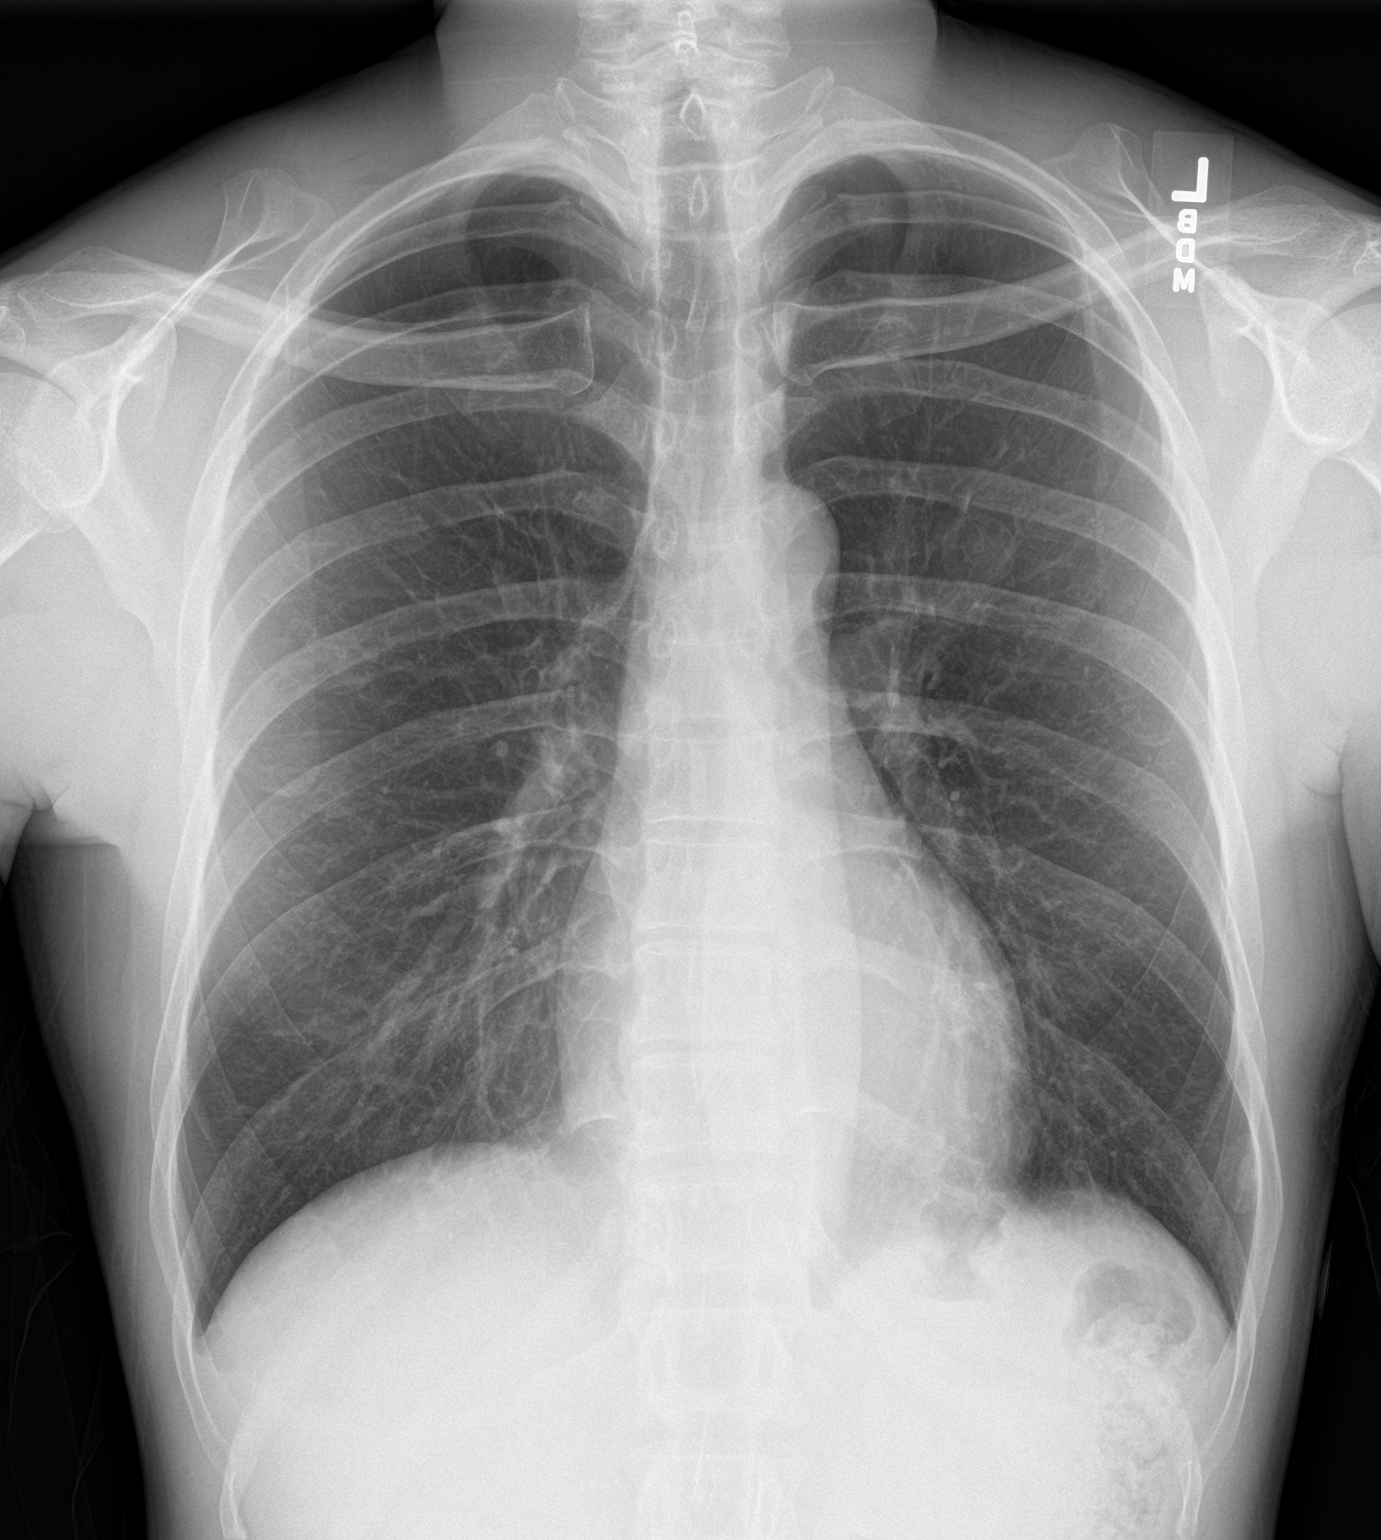

[chest lat]
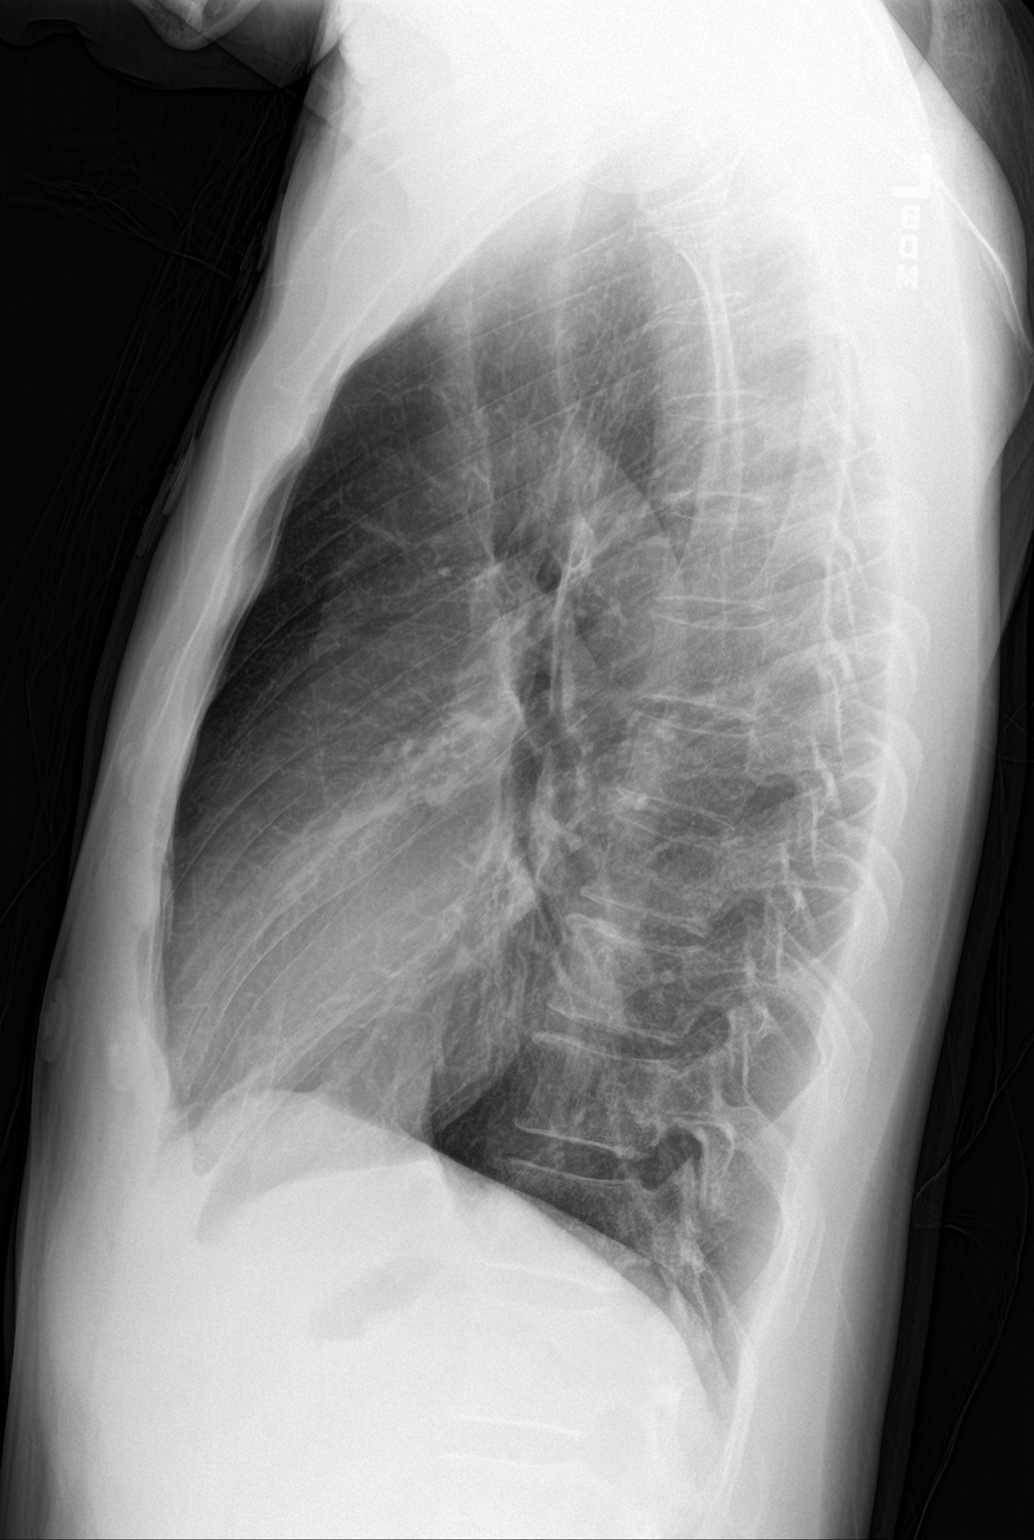

[2 of 2 positions shown; findings below may reference images not displayed]

FINDINGS: There is no edema or consolidation. Heart size and pulmonary
vascularity are normal. No adenopathy. No pneumothorax. No bone
lesions.
IMPRESSION: No edema or consolidation.

## 2017-06-05 ENCOUNTER — Encounter (HOSPITAL_COMMUNITY): Payer: Self-pay

## 2017-06-05 ENCOUNTER — Emergency Department (HOSPITAL_COMMUNITY): Payer: BLUE CROSS/BLUE SHIELD

## 2017-06-05 ENCOUNTER — Emergency Department (HOSPITAL_COMMUNITY)
Admission: EM | Admit: 2017-06-05 | Discharge: 2017-06-05 | Disposition: A | Discharge status: Home / Self Care | Payer: BLUE CROSS/BLUE SHIELD | Attending: Emergency Medicine | Admitting: Emergency Medicine

## 2017-06-05 DIAGNOSIS — I10 Essential (primary) hypertension: Secondary | ICD-10-CM | POA: Diagnosis not present

## 2017-06-05 DIAGNOSIS — F17210 Nicotine dependence, cigarettes, uncomplicated: Secondary | ICD-10-CM | POA: Insufficient documentation

## 2017-06-05 DIAGNOSIS — M25561 Pain in right knee: Secondary | ICD-10-CM | POA: Diagnosis not present

## 2017-06-05 MED ORDER — PREDNISONE 10 MG PO TABS: 40.0000 mg | ORAL_TABLET | Freq: Every day | ORAL | 0 refills | Status: AC

## 2017-06-05 MED ORDER — KETOROLAC TROMETHAMINE 15 MG/ML IJ SOLN
15.0000 mg | Freq: Once | INTRAMUSCULAR | Status: AC
  Administered 2017-06-05: 15 mg via INTRAMUSCULAR
  Filled 2017-06-05 (×2): qty 1

## 2017-06-05 NOTE — ED Provider Notes (Signed)
[facility] Provider Note   CSN: [id number] Arrival date & time: 06/05/17  1011     History   Chief Complaint Chief Complaint  Patient presents with  . Knee Pain    HPI Eric Parsons is a 46 y.o. male.  HPI  Patient presents to ED for evaluation of R knee pain that began 3 days ago. He reports similar symptoms in the past that have resolved on their own. Has tried anti-inflammatories with mild relief in symptoms. He reports wearing his knee brace and using crutches with some relief in his symptoms from previous visit in the ED several months ago. He denies injury, falls, trauma, fevers, chills, temperature change of joint, swelling, history of DVT, recent surgery, history of cancer, chest pain, trouble breathing. Of note, patient was seen here about 11 months ago for similar symptoms. X-rays at that time were negative. He reports his symptoms just improved on their own that time.  Past Medical History:  Diagnosis Date  . Hypertension     Patient Active Problem List   Diagnosis Date Noted  . Costochondritis   . Chest pain 01/31/2016  . GERD (gastroesophageal reflux disease) 01/31/2016  . Hypertension 01/31/2016    History reviewed. No pertinent surgical history.     Home Medications    Prior to Admission medications   Medication Sig Start Date End Date Taking? Authorizing Provider  ondansetron (ZOFRAN ODT) 4 MG disintegrating tablet Take 1 tablet (4 mg total) by mouth every 8 (eight) hours as needed for nausea or vomiting. 01/16/17   Mackuen, Courteney Lyn, MD  predniSONE (DELTASONE) 10 MG tablet Take 4 tablets (40 mg total) by mouth daily. 06/05/17 06/10/17  Ferdinand Revoir, PA-C  ranitidine (ZANTAC) 150 MG tablet Take 1 tablet (150 mg total) by mouth 2 (two) times daily. Patient not taking: Reported on 09/17/2016 08/20/16   Gilda CreasePollina, Christopher J, MD  sucralfate (CARAFATE) 1 GM/10ML suspension Take 10 mLs (1 g total) by mouth 4 (four) times daily -  with meals and at  bedtime. Patient not taking: Reported on 09/17/2016 08/20/16   Gilda CreasePollina, Christopher J, MD    Family History Family History  Problem Relation Age of Onset  . Hypertension Mother   . Diabetes Mother     Social History Social History  Substance Use Topics  . Smoking status: Current Every Day Smoker    Packs/day: 0.50    Types: Cigarettes  . Smokeless tobacco: Current User    Types: Chew  . Alcohol use Yes     Allergies   Patient has no known allergies.   Review of Systems Review of Systems  Constitutional: Negative for chills and fever.  Gastrointestinal: Negative for nausea and vomiting.  Musculoskeletal: Positive for arthralgias. Negative for gait problem, joint swelling and myalgias.  Skin: Negative for color change and wound.     Physical Exam Updated Vital Signs BP (!) 142/97 (BP Location: Right Arm)   Pulse 89   Temp 98.2 F (36.8 C) (Oral)   Resp 18   Ht 5\' 10"  (1.778 m)   Wt 70.3 kg (155 lb)   SpO2 100%   BMI 22.24 kg/m   Physical Exam  Constitutional: He appears well-developed and well-nourished. No distress.  HENT:  Head: Normocephalic and atraumatic.  Eyes: Conjunctivae and EOM are normal. No scleral icterus.  Neck: Normal range of motion.  Pulmonary/Chest: Effort normal. No respiratory distress.  Musculoskeletal: Normal range of motion. He exhibits tenderness. He exhibits no edema or deformity.  Tenderness  to palpation over the right patella. No visible deformity, color change, swelling or bruising noted. no calf tenderness present. Full active and passive range of motion of the knee. Sensation intact to light touch. 2+ DP pulses bilaterally. Strength 5/5 in bilateral lower extremities and at knees.  Neurological: He is alert.  Skin: No rash noted. He is not diaphoretic.  No overlying cellulitis or temperature change noted around right knee.  Psychiatric: He has a normal mood and affect.  Nursing note and vitals reviewed.    ED Treatments /  Results  Labs (all labs ordered are listed, but only abnormal results are displayed) Labs Reviewed - No data to display  EKG  EKG Interpretation None       Radiology Dg Knee Ap/lat W/sunrise Right  Result Date: 06/05/2017 CLINICAL DATA:  Right knee pain for 4 days.  No known injury. EXAM: RIGHT KNEE 3 VIEWS COMPARISON:  Right knee x-rays dated July 21, 2016. FINDINGS: No evidence of fracture, dislocation, or joint effusion. No evidence of arthropathy or other focal bone abnormality. Soft tissues are unremarkable. IMPRESSION: Negative. Electronically Signed   By: Obie DredgeWilliam T Derry M.D.   On: 06/05/2017 11:21    Procedures Procedures (including critical care time)  Medications Ordered in ED Medications  ketorolac (TORADOL) 15 MG/ML injection 15 mg (15 mg Intramuscular Given 06/05/17 1123)     Initial Impression / Assessment and Plan / ED Course  I have reviewed the triage vital signs and the nursing notes.  Pertinent labs & imaging results that were available during my care of the patient were reviewed by me and considered in my medical decision making (see chart for details).     Patient presents to ED for evaluation of right knee pain that began 3 days ago. He has history of similar symptoms about 11 months ago which resolved on their own. He states he was never able to follow up with orthopedics. He denies any injury, falls, trauma to the area. Low suspicion for septic joint based on appearance and absence of redness, temperature change. He has full active and passive range of motion of the knee with some tenderness to palpation around but no visible deformity. No visible swelling noted. X-rays were negative. Symptoms could be due to muscle strain or overuse in the area. He is able to ambulate here in the ED. Pain improved here with Toradol. We'll give steroids to help with inflammation and advised patient to use knee brace as tolerated but also bear weight as tolerated. I  encouraged him to follow up with orthopedist for further evaluation and management of his pain and for any further imaging. Patient appears stable for discharge at this time. Strict return precautions given.  Final Clinical Impressions(s) / ED Diagnoses   Final diagnoses:  Knee pain, right    New Prescriptions New Prescriptions   PREDNISONE (DELTASONE) 10 MG TABLET    Take 4 tablets (40 mg total) by mouth daily.     Dietrich PatesKhatri, Tucker Steedley, PA-C 06/05/17 1153    Cathren LaineSteinl, Kevin, MD 06/05/17 1212

## 2017-06-05 NOTE — ED Triage Notes (Signed)
Pt presents for evaluation of R knee pain with radiation down leg. Pt denies injury to leg, reports has ambulated with ease. Pt reports has happened in the past but never had surgery.

## 2017-06-05 NOTE — Discharge Instructions (Signed)
Please read attached information regarding your condition. Take prednisone for 5 days as directed. Wear brace and use crutches as tolerated but Bear weight on knee as tolerated to prevent stiffness. Apply heat and stretch as tolerated. Follow-up with orthopedics for further evaluation. Return to ED for worsening pain, injury, numbness, weakness, trouble walking, swelling, redness or severe pain of joint.

## 2017-06-05 NOTE — ED Notes (Signed)
Patient ambulated to room without difficulty.  Eating breakfast in the room.

## 2017-07-22 ENCOUNTER — Emergency Department (HOSPITAL_COMMUNITY)
Admission: EM | Admit: 2017-07-22 | Discharge: 2017-07-22 | Disposition: A | Discharge status: Home / Self Care | Payer: BLUE CROSS/BLUE SHIELD | Attending: Emergency Medicine | Admitting: Emergency Medicine

## 2017-07-22 ENCOUNTER — Encounter (HOSPITAL_COMMUNITY): Payer: Self-pay | Admitting: *Deleted

## 2017-07-22 ENCOUNTER — Emergency Department (HOSPITAL_COMMUNITY): Payer: BLUE CROSS/BLUE SHIELD

## 2017-07-22 DIAGNOSIS — Z5321 Procedure and treatment not carried out due to patient leaving prior to being seen by health care provider: Secondary | ICD-10-CM | POA: Diagnosis not present

## 2017-07-22 DIAGNOSIS — R51 Headache: Secondary | ICD-10-CM | POA: Diagnosis present

## 2017-07-22 NOTE — ED Notes (Signed)
Called to be roomed to provider. No answer. 

## 2017-07-22 NOTE — ED Triage Notes (Signed)
Pt states Saturday woke up with cough, hard to breath, and coughing up yellow sputum, body aches, and posterior head pain.  Placed mask on patient.

## 2017-07-22 NOTE — ED Notes (Signed)
Called pt's name to obtain vital signs, no one answered. 

## 2017-12-03 ENCOUNTER — Ambulatory Visit (HOSPITAL_COMMUNITY)
Admission: EM | Admit: 2017-12-03 | Discharge: 2017-12-03 | Disposition: A | Discharge status: Home / Self Care | Payer: BLUE CROSS/BLUE SHIELD | Attending: Family Medicine | Admitting: Family Medicine

## 2017-12-03 ENCOUNTER — Other Ambulatory Visit: Payer: Self-pay

## 2017-12-03 ENCOUNTER — Encounter (HOSPITAL_COMMUNITY): Payer: Self-pay | Admitting: Emergency Medicine

## 2017-12-03 ENCOUNTER — Emergency Department (HOSPITAL_COMMUNITY)
Admission: EM | Admit: 2017-12-03 | Discharge: 2017-12-03 | Discharge status: Against Medical Advice | Payer: BLUE CROSS/BLUE SHIELD

## 2017-12-03 DIAGNOSIS — R519 Headache, unspecified: Secondary | ICD-10-CM

## 2017-12-03 DIAGNOSIS — R110 Nausea: Secondary | ICD-10-CM | POA: Diagnosis not present

## 2017-12-03 DIAGNOSIS — R51 Headache: Secondary | ICD-10-CM | POA: Diagnosis not present

## 2017-12-03 MED ORDER — KETOROLAC TROMETHAMINE 60 MG/2ML IM SOLN
60.0000 mg | Freq: Once | INTRAMUSCULAR | Status: AC
  Administered 2017-12-03: 60 mg via INTRAMUSCULAR

## 2017-12-03 MED ORDER — KETOROLAC TROMETHAMINE 60 MG/2ML IM SOLN
INTRAMUSCULAR | Status: AC
  Filled 2017-12-03: qty 2

## 2017-12-03 MED ORDER — METOCLOPRAMIDE HCL 5 MG/ML IJ SOLN
5.0000 mg | Freq: Once | INTRAMUSCULAR | Status: AC
Start: 2017-12-03 — End: 2017-12-03
  Administered 2017-12-03: 15:00:00 via INTRAMUSCULAR

## 2017-12-03 MED ORDER — ONDANSETRON 4 MG PO TBDP: 4.0000 mg | ORAL_TABLET | Freq: Three times a day (TID) | ORAL | 0 refills | Status: AC | PRN

## 2017-12-03 MED ORDER — DEXAMETHASONE SODIUM PHOSPHATE 10 MG/ML IJ SOLN
10.0000 mg | Freq: Once | INTRAMUSCULAR | Status: AC
  Administered 2017-12-03: 10 mg via INTRAMUSCULAR

## 2017-12-03 MED ORDER — NAPROXEN 500 MG PO TABS: 500.0000 mg | ORAL_TABLET | Freq: Two times a day (BID) | ORAL | 0 refills | Status: AC

## 2017-12-03 MED ORDER — METOCLOPRAMIDE HCL 5 MG/ML IJ SOLN
INTRAMUSCULAR | Status: AC
  Filled 2017-12-03: qty 2

## 2017-12-03 MED ORDER — DEXAMETHASONE SODIUM PHOSPHATE 10 MG/ML IJ SOLN
INTRAMUSCULAR | Status: AC
  Filled 2017-12-03: qty 1

## 2017-12-03 NOTE — ED Triage Notes (Signed)
PT C/O: constant HA onset 2 days associated w/nausea  TAKING MEDS: acetaminophen today at 1700  A&O x4... NAD... Ambulatory

## 2017-12-03 NOTE — ED Provider Notes (Signed)
[facility]    CSN: [id number] Arrival date & time: 12/03/17  1326     History   Chief Complaint Chief Complaint  Patient presents with  . Headache    HPI Eric Parsons is a 47 y.o. male 3 of hypertension presenting today with a headache.  Take began yesterday around 6:00 and has been able to relieve it.  He took Tylenol this morning at 5 AM.  States that headache is constant and all over.  When asked to describe the nature he just says he just knows it is there.  He has any changes in vision, any difficulty speaking, any on-sided weakness.  He states he does not normally get headaches, but this is not the worst headache of his life.  Denies any photophobia, but does endorse some associated nausea that began this morning.   HPI  Past Medical History:  Diagnosis Date  . Hypertension     Patient Active Problem List   Diagnosis Date Noted  . Costochondritis   . Chest pain 01/31/2016  . GERD (gastroesophageal reflux disease) 01/31/2016  . Hypertension 01/31/2016    History reviewed. No pertinent surgical history.     Home Medications    Prior to Admission medications   Medication Sig Start Date End Date Taking? Authorizing Provider  esomeprazole (NEXIUM) 20 MG capsule Take 20 mg by mouth daily at 12 noon.   Yes [provider]  naproxen (NAPROSYN) 500 MG tablet Take 1 tablet (500 mg total) by mouth 2 (two) times daily for 5 days. 12/03/17 12/08/17  Clarkson Rosselli C, PA-C  ondansetron (ZOFRAN ODT) 4 MG disintegrating tablet Take 1 tablet (4 mg total) by mouth every 8 (eight) hours as needed for up to 2 days for nausea or vomiting. 12/03/17 12/05/17  Dehaven Sine C, PA-C  ranitidine (ZANTAC) 150 MG tablet Take 1 tablet (150 mg total) by mouth 2 (two) times daily. Patient not taking: Reported on 09/17/2016 08/20/16   Gilda CreasePollina, Christopher J, MD  sucralfate (CARAFATE) 1 GM/10ML suspension Take 10 mLs (1 g total) by mouth 4 (four) times daily -  with meals and  at bedtime. Patient not taking: Reported on 09/17/2016 08/20/16   Gilda CreasePollina, Christopher J, MD    Family History Family History  Problem Relation Age of Onset  . Hypertension Mother   . Diabetes Mother     Social History Social History   Tobacco Use  . Smoking status: Current Every Day Smoker    Packs/day: 0.50    Types: Cigarettes  . Smokeless tobacco: Current User    Types: Chew  Substance Use Topics  . Alcohol use: Yes  . Drug use: Yes    Types: Marijuana     Allergies   Patient has no known allergies.   Review of Systems Review of Systems  Constitutional: Negative for fatigue and fever.  Eyes: Negative for photophobia, pain and visual disturbance.  Respiratory: Negative for shortness of breath.   Cardiovascular: Negative for chest pain.  Gastrointestinal: Positive for nausea. Negative for abdominal pain and vomiting.  Musculoskeletal: Negative for neck pain and neck stiffness.  Neurological: Positive for headaches. Negative for dizziness, syncope, facial asymmetry, speech difficulty, weakness and light-headedness.     Physical Exam Triage Vital Signs ED Triage Vitals  Enc Vitals Group     BP 12/03/17 1419 133/88     Pulse Rate 12/03/17 1419 70     Resp 12/03/17 1419 20     Temp 12/03/17 1419 98.3 F (36.8  C)     Temp Source 12/03/17 1419 Oral     SpO2 12/03/17 1419 100 %     Weight --      Height --      Head Circumference --      Peak Flow --      Pain Score 12/03/17 1420 10     Pain Loc --      Pain Edu? --      Excl. in GC? --    No data found.  Updated Vital Signs BP 133/88 (BP Location: Left Arm)   Pulse 70   Temp 98.3 F (36.8 C) (Oral)   Resp 20   SpO2 100%    Physical Exam  Constitutional: He is oriented to person, place, and time. He appears well-developed and well-nourished.  Patient lying on exam table with cover over head  HENT:  Head: Normocephalic and atraumatic.  Eyes: Conjunctivae and EOM are normal. Pupils are equal,  round, and reactive to light.  Neck: Neck supple.  Cardiovascular: Normal rate and regular rhythm.  No murmur heard. Pulmonary/Chest: Effort normal and breath sounds normal. No respiratory distress.  Breathing comfortably at rest  Abdominal: Soft. There is no tenderness.  Musculoskeletal: He exhibits no edema.  Neurological: He is alert and oriented to person, place, and time. He has normal strength. No cranial nerve deficit or sensory deficit. Coordination normal.  Cranial nerves II through XII grossly intact.  Normal coordination- normal finger to nose, rapid alternating movements.  Strength 5 out of 5 follow-up bilaterally at shoulders and hips and knee.  No facial asymmetry.  Skin: Skin is warm and dry.  Psychiatric: He has a normal mood and affect.  Nursing note and vitals reviewed.    UC Treatments / Results  Labs (all labs ordered are listed, but only abnormal results are displayed) Labs Reviewed - No data to display  EKG  EKG Interpretation None       Radiology No results found.  Procedures Procedures (including critical care time)  Medications Ordered in UC Medications  ketorolac (TORADOL) injection 60 mg (not administered)  metoCLOPramide (REGLAN) injection 5 mg (not administered)  dexamethasone (DECADRON) injection 10 mg (not administered)     Initial Impression / Assessment and Plan / UC Course  I have reviewed the triage vital signs and the nursing notes.  Pertinent labs & imaging results that were available during my care of the patient were reviewed by me and considered in my medical decision making (see chart for details).     Patient with headache controlled by Tylenol.  Without any focal neuro deficits.  Blood pressure 133/88, controlled. Will give Toradol Reglan and Decadron.  Sent home with Naprosyn and Zofran. Discussed strict return precautions. Patient verbalized understanding and is agreeable with plan.   Final Clinical Impressions(s) / UC  Diagnoses   Final diagnoses:  Acute nonintractable headache, unspecified headache type    ED Discharge Orders        Ordered    naproxen (NAPROSYN) 500 MG tablet  2 times daily     12/03/17 1444    ondansetron (ZOFRAN ODT) 4 MG disintegrating tablet  Every 8 hours PRN     12/03/17 1447       Controlled Substance Prescriptions [place] Controlled Substance Registry consulted? Not Applicable   Lew Dawes, [place] 12/03/17 1458

## 2017-12-03 NOTE — Discharge Instructions (Signed)
For your headache today we gave you an injection of Toradol, Reglan, Decadron.  Should start working in 30-45 minutes.  If your headache returns please use naprosyn twice daily as needed OR Use other anti-inflammatories-You may take up to 800 mg Ibuprofen every 8 hours with food. You may supplement Ibuprofen with Tylenol 213 028 3825 mg every 8 hours.   I have sent in some Zofran also for you to use as needed for nausea if this returns.  If your headache is associated with vision changes, slurring of speech, one-sided weakness, shortness of breath, chest pain, facial drooping, or returns as the worst headache of her life please go to the emergency room immediately.

## 2018-01-02 ENCOUNTER — Encounter (HOSPITAL_COMMUNITY): Payer: Self-pay | Admitting: Emergency Medicine

## 2018-01-02 ENCOUNTER — Emergency Department (HOSPITAL_COMMUNITY)
Admission: EM | Admit: 2018-01-02 | Discharge: 2018-01-02 | Disposition: A | Discharge status: Home / Self Care | Payer: BLUE CROSS/BLUE SHIELD | Attending: Emergency Medicine | Admitting: Emergency Medicine

## 2018-01-02 ENCOUNTER — Encounter (HOSPITAL_COMMUNITY): Payer: Self-pay

## 2018-01-02 ENCOUNTER — Other Ambulatory Visit: Payer: Self-pay

## 2018-01-02 ENCOUNTER — Emergency Department (HOSPITAL_BASED_OUTPATIENT_CLINIC_OR_DEPARTMENT_OTHER)
Admit: 2018-01-02 | Discharge: 2018-01-02 | Disposition: A | Discharge status: Home / Self Care | Payer: BLUE CROSS/BLUE SHIELD | Attending: Emergency Medicine | Admitting: Emergency Medicine

## 2018-01-02 ENCOUNTER — Ambulatory Visit (HOSPITAL_COMMUNITY)
Admission: EM | Admit: 2018-01-02 | Discharge: 2018-01-02 | Disposition: A | Discharge status: Home / Self Care | Payer: BLUE CROSS/BLUE SHIELD | Attending: Internal Medicine | Admitting: Internal Medicine

## 2018-01-02 DIAGNOSIS — M79651 Pain in right thigh: Secondary | ICD-10-CM | POA: Diagnosis not present

## 2018-01-02 DIAGNOSIS — M79652 Pain in left thigh: Secondary | ICD-10-CM | POA: Diagnosis not present

## 2018-01-02 DIAGNOSIS — M79609 Pain in unspecified limb: Secondary | ICD-10-CM

## 2018-01-02 DIAGNOSIS — Z5321 Procedure and treatment not carried out due to patient leaving prior to being seen by health care provider: Secondary | ICD-10-CM | POA: Diagnosis not present

## 2018-01-02 DIAGNOSIS — M79605 Pain in left leg: Secondary | ICD-10-CM | POA: Insufficient documentation

## 2018-01-02 DIAGNOSIS — M79604 Pain in right leg: Secondary | ICD-10-CM | POA: Insufficient documentation

## 2018-01-02 DIAGNOSIS — M79662 Pain in left lower leg: Secondary | ICD-10-CM

## 2018-01-02 DIAGNOSIS — M79661 Pain in right lower leg: Secondary | ICD-10-CM

## 2018-01-02 LAB — CBC WITH DIFFERENTIAL/PLATELET
BASOS ABS: 0 10*3/uL (ref 0.0–0.1)
Basophils Relative: 0 %
EOS PCT: 2 %
Eosinophils Absolute: 0.1 10*3/uL (ref 0.0–0.7)
HEMATOCRIT: 36.3 % — AB (ref 39.0–52.0)
Hemoglobin: 12 g/dL — ABNORMAL LOW (ref 13.0–17.0)
LYMPHS PCT: 33 %
Lymphs Abs: 3 10*3/uL (ref 0.7–4.0)
MCH: 28 pg (ref 26.0–34.0)
MCHC: 33.1 g/dL (ref 30.0–36.0)
MCV: 84.8 fL (ref 78.0–100.0)
MONO ABS: 0.5 10*3/uL (ref 0.1–1.0)
MONOS PCT: 6 %
NEUTROS ABS: 5.3 10*3/uL (ref 1.7–7.7)
Neutrophils Relative %: 59 %
PLATELETS: 248 10*3/uL (ref 150–400)
RBC: 4.28 MIL/uL (ref 4.22–5.81)
RDW: 14 % (ref 11.5–15.5)
WBC: 9 10*3/uL (ref 4.0–10.5)

## 2018-01-02 LAB — BASIC METABOLIC PANEL
Anion gap: 8 (ref 5–15)
BUN: 11 mg/dL (ref 6–20)
CALCIUM: 8.6 mg/dL — AB (ref 8.9–10.3)
CO2: 25 mmol/L (ref 22–32)
CREATININE: 1.15 mg/dL (ref 0.61–1.24)
Chloride: 106 mmol/L (ref 101–111)
GFR calc Af Amer: >60 mL/min (ref >60)
GLUCOSE: 107 mg/dL — AB (ref 65–99)
Potassium: 3.6 mmol/L (ref 3.5–5.1)
Sodium: 139 mmol/L (ref 135–145)

## 2018-01-02 NOTE — ED Provider Notes (Addendum)
Patient placed in Quick Look pathway, seen and evaluated   Chief Complaint: bilateral leg pain; sent from urgent care for DVT   HPI:   Pain to bilateral legs from thighs to knees to bottom of calves described as "achy" and "tight" x 5 days. Atraumatic. Worse with walking, flexing at joints, palpation. Feels like he exercised and his muscles are sore. 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   ROS: No back pain. No numbness, heaviness, paresthesias to extremities. No exposure to ticks. No rash. No swelling. No recent travel, estrogen use, h/o DVT/PE, recent immobilization or surgery.   Physical Exam:   Gen: No distress  Neuro: Awake and Alert  Skin: Warm    Focused Exam: Antalgic gait. Diffuse tenderness to bilateral thighs, popliteal spaces, calves. Pain with PROM of hips and knees, worse with extension. No focal bony tenderness to hips, anterior knee or ankle joint. DP and PT pulses 2+ bilaterally, no asymmetric LE edema, warmth or erythema. Diminished knee DTRs but symmetric bilaterally. No midline L-spine or sacral spinous process tenderness or step offs. No back pain with SLR test bilaterally.  Initiation of care has begun. The patient has been counseled on the process, plan, and necessity for staying for the completion/evaluation, and the remainder of the medical screening examination. Pt seen at urgent care and sent to ER for evaluation of DVT. On their exam pt had more tenderness to left lower calf, on my exam pain is bilateral.  Will obtain screening labs to check for electrolyte abnormalities and bilateral DVT studies.     Liberty HandyGibbons, Mahogony Gilchrest J, PA-C 01/02/18 1658    Liberty HandyGibbons, Kimie Pidcock J, PA-C 01/14/18 1159    Terrilee FilesButler, Michael C, MD 01/14/18 [phone]

## 2018-01-02 NOTE — ED Notes (Signed)
Patient did not want to stay.  No signs of distress.  Ambulatory to door

## 2018-01-02 NOTE — ED Notes (Addendum)
Patient is A&Ox4 at this time.  Patient in no signs of distress.  Please see providers note for complete history and physical exam.  

## 2018-01-02 NOTE — Progress Notes (Signed)
Bilateral lower extremity venous duplex has been completed. Negative for DVT. Results were given to Sharen Hecklaudia Gibbons PA.  01/02/18 4:57 PM Olen CordialGreg Varina Hulon RVT

## 2018-01-02 NOTE — ED Notes (Signed)
Called PT, no answer.

## 2018-01-02 NOTE — Discharge Instructions (Signed)
Recommend due to severe pain in both lower calves as well as pain in upper thighs, recommend further evaluation at the ER now. Concerned over possible DVT.

## 2018-01-02 NOTE — ED Provider Notes (Signed)
[facility]    CSN: [id number] Arrival date & time: 01/02/18  1102     History   Chief Complaint Chief Complaint  Patient presents with  . Leg Pain    bilateral    HPI Eric Parsons is a 47 y.o. male.   47 year old male accompanied by his wife with bilateral lower leg pain with radiation of pain to lower groin area. Woke up 5 days ago in the middle of the night to go to the bathroom and started experiencing pain in both legs. Pain has remained consistent and is throbbing. Has noticed when he flexes his calf or tries to walk the pain gets worse. Also very painful to touch. Denies any numbness of pain in his feet. He also denies any specific injury or cause for the pain. 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 He denies any fever, chest pain, shortness of breath, abdominal pain, flank pain or dysuria. He does smoke cigarettes daily. He tried Tylenol and wearing knee braces on both legs to help with symptoms with no relief. Has history of HTN and GERD but otherwise no other chronic health issues and takes Nexium daily.    The history is provided by the patient.    Past Medical History:  Diagnosis Date  . Hypertension     Patient Active Problem List   Diagnosis Date Noted  . Costochondritis   . Chest pain 01/31/2016  . GERD (gastroesophageal reflux disease) 01/31/2016  . Hypertension 01/31/2016    History reviewed. No pertinent surgical history.     Home Medications    Prior to Admission medications   Medication Sig Start Date End Date Taking? Authorizing Provider  esomeprazole (NEXIUM) 20 MG capsule Take 20 mg by mouth daily at 12 noon.   Yes [provider]    Family History Family History  Problem Relation Age of Onset  . Hypertension Mother   . Diabetes Mother     Social History Social History   Tobacco Use  . Smoking status: Current Every Day Smoker    Packs/day:  0.50    Types: Cigarettes  . Smokeless tobacco: Current User    Types: Chew  Substance Use Topics  . Alcohol use: Yes  . Drug use: Yes    Types: Marijuana     Allergies   Patient has no known allergies.   Review of Systems Review of Systems  Constitutional: Positive for activity change. Negative for appetite change, chills, fatigue and fever.  Eyes: Negative for photophobia and visual disturbance.  Respiratory: Negative for chest tightness, shortness of breath and wheezing.   Cardiovascular: Negative for chest pain and palpitations.  Gastrointestinal: Negative for abdominal pain, nausea and vomiting.  Genitourinary: Negative for decreased urine volume, difficulty urinating, dysuria, flank pain and hematuria.  Musculoskeletal: Positive for gait problem and myalgias. Negative for back pain.  Skin: Negative for color change, rash and wound.  Neurological: Negative for dizziness, tremors, seizures, syncope, speech difficulty, weakness, light-headedness, numbness and headaches.  Hematological: Negative for adenopathy. Does not bruise/bleed easily.     Physical Exam Triage Vital Signs ED Triage Vitals  Enc Vitals Group     BP 01/02/18 1202 138/87     Pulse Rate 01/02/18 1202 84     Resp --      Temp 01/02/18 1202 98.1 F (36.7 C)     Temp Source 01/02/18 1202 Oral  SpO2 01/02/18 1202 100 %     Weight --      Height --      Head Circumference --      Peak Flow --      Pain Score 01/02/18 1200 10     Pain Loc --      Pain Edu? --      Excl. in GC? --    No data found.  Updated Vital Signs BP 138/87 (BP Location: Left Arm)   Pulse 84   Temp 98.1 F (36.7 C) (Oral)   SpO2 100%   Visual Acuity Right Eye Distance:   Left Eye Distance:   Bilateral Distance:    Right Eye Near:   Left Eye Near:    Bilateral Near:     Physical Exam  Constitutional: He is oriented to person, place, and time. He appears well-developed and well-nourished. No distress.  Patient  sitting on exam table and appears to be in significant pain.   HENT:  Head: Normocephalic and atraumatic.  Eyes: Conjunctivae and EOM are normal.  Neck: Normal range of motion.  Cardiovascular: Normal rate, regular rhythm and normal heart sounds.  No murmur heard. Pulmonary/Chest: Effort normal and breath sounds normal. No stridor. No respiratory distress. He has no decreased breath sounds. He has no wheezes. He has no rhonchi. He has no rales.  Musculoskeletal: He exhibits tenderness.       Right lower leg: He exhibits tenderness. He exhibits no swelling, no edema and no deformity.       Left lower leg: He exhibits tenderness and swelling. He exhibits no edema, no deformity and no laceration.       Legs: Has decreased range of motion of both upper and lower legs, especially with flexion. Tender along entire leg from base of ankle to groin bilaterally with slightly more tenderness and swelling of left lower calf. No distinct increase in warmth. No distinct redness but difficult to determine due to skin color. Good distal pulses and normal sensation. No distinct neuro deficits noted.   Neurological: He is alert and oriented to person, place, and time. He has normal strength. No sensory deficit.  Skin: Skin is warm and dry. Capillary refill takes less than 2 seconds. No rash noted. No erythema.  Psychiatric: He has a normal mood and affect. His behavior is normal. Judgment and thought content normal.     UC Treatments / Results  Labs (all labs ordered are listed, but only abnormal results are displayed) Labs Reviewed - No data to display  EKG  EKG Interpretation None       Radiology No results found.  Procedures Procedures (including critical care time)  Medications Ordered in UC Medications - No data to display   Initial Impression / Assessment and Plan / UC Course  I have reviewed the triage vital signs and the nursing notes.  Pertinent labs & imaging results that were  available during my care of the patient were reviewed by me and considered in my medical decision making (see chart for details).    Reviewed with patient and his wife that uncertain of cause of his bilateral leg pain. May be musculoskeletal but increased concern for possible DVT due to extreme tenderness of left lower calf and difficulty walking due to pain. Discussed further evaluation now at the ER. Patient and wife understand and agree with plan- will go to ER now for further evaluation.   Final Clinical Impressions(s) / UC Diagnoses   Final  diagnoses:  Bilateral calf pain  Pain in both thighs    ED Discharge Orders    None       Controlled Substance Prescriptions [place] Controlled Substance Registry consulted? Not Applicable   Sudie Grumblingmyot, Kenta Laster Berry, NP 01/02/18 1329

## 2018-01-02 NOTE — ED Triage Notes (Signed)
Pt presents to th ed with complaints of pain in both legs, denies any injury.

## 2018-01-02 NOTE — ED Notes (Signed)
Pt called twice, no answer 

## 2018-01-02 NOTE — ED Triage Notes (Signed)
Pt states he woke up Monday morning with bilateral upper and lower leg pain.  He states is hurts to walk and it hurts to the touch.  He describes it as a pressure.

## 2018-02-11 ENCOUNTER — Encounter: Payer: Self-pay | Admitting: Internal Medicine

## 2018-03-25 ENCOUNTER — Encounter (INDEPENDENT_AMBULATORY_CARE_PROVIDER_SITE_OTHER): Payer: Self-pay

## 2018-03-25 ENCOUNTER — Ambulatory Visit (INDEPENDENT_AMBULATORY_CARE_PROVIDER_SITE_OTHER): Payer: BLUE CROSS/BLUE SHIELD | Admitting: Internal Medicine

## 2018-03-25 ENCOUNTER — Encounter: Payer: Self-pay | Admitting: Internal Medicine

## 2018-03-25 VITALS — BP 110/70 | HR 94 | Ht 70.0 in | Wt 153.0 lb

## 2018-03-25 DIAGNOSIS — R197 Diarrhea, unspecified: Secondary | ICD-10-CM | POA: Diagnosis not present

## 2018-03-25 DIAGNOSIS — R109 Unspecified abdominal pain: Secondary | ICD-10-CM

## 2018-03-25 DIAGNOSIS — R112 Nausea with vomiting, unspecified: Secondary | ICD-10-CM

## 2018-03-25 MED ORDER — NA SULFATE-K SULFATE-MG SULF 17.5-3.13-1.6 GM/177ML PO SOLN: 1.0000 | Freq: Once | ORAL | 0 refills | Status: AC

## 2018-03-25 MED ORDER — DICYCLOMINE HCL 20 MG PO TABS: 20.0000 mg | ORAL_TABLET | Freq: Three times a day (TID) | ORAL | 3 refills | Status: AC

## 2018-03-25 MED ORDER — COLESTIPOL HCL 1 G PO TABS: 2.0000 g | ORAL_TABLET | Freq: Two times a day (BID) | ORAL | 3 refills | Status: AC

## 2018-03-25 NOTE — Patient Instructions (Signed)
We have sent the following medications to your pharmacy for you to pick up at your convenience:  Colestid, Bentyl  You have been scheduled for an endoscopy and colonoscopy. Please follow the written instructions given to you at your visit today. Please pick up your prep supplies at the pharmacy within the next 1-3 days. If you use inhalers (even only as needed), please bring them with you on the day of your procedure. Your physician has requested that you go to www.startemmi.com and enter the access code given to you at your visit today. This web site gives a general overview about your procedure. However, you should still follow specific instructions given to you by our office regarding your preparation for the procedure.   Stop taking the Colestid a few days before the colonoscopy to help with the prep.  Try to reduce use of Marijuana

## 2018-03-30 ENCOUNTER — Encounter (HOSPITAL_COMMUNITY): Payer: Self-pay | Admitting: Emergency Medicine

## 2018-03-30 ENCOUNTER — Emergency Department (HOSPITAL_COMMUNITY): Payer: BLUE CROSS/BLUE SHIELD | Admitting: Certified Registered"

## 2018-03-30 ENCOUNTER — Inpatient Hospital Stay (HOSPITAL_COMMUNITY): Payer: BLUE CROSS/BLUE SHIELD

## 2018-03-30 ENCOUNTER — Inpatient Hospital Stay (HOSPITAL_COMMUNITY)
Admission: EM | Admit: 2018-03-30 | Discharge: 2018-04-04 | DRG: 504 | Disposition: A | Discharge status: Home / Self Care | Payer: BLUE CROSS/BLUE SHIELD | Attending: Orthopedic Surgery | Admitting: Orthopedic Surgery

## 2018-03-30 ENCOUNTER — Emergency Department (HOSPITAL_COMMUNITY): Payer: BLUE CROSS/BLUE SHIELD

## 2018-03-30 ENCOUNTER — Encounter (HOSPITAL_COMMUNITY)
Admission: EM | Disposition: A | Discharge status: Home / Self Care | Payer: Self-pay | Source: Home / Self Care | Attending: Orthopedic Surgery

## 2018-03-30 ENCOUNTER — Ambulatory Visit (HOSPITAL_COMMUNITY): Payer: BLUE CROSS/BLUE SHIELD

## 2018-03-30 DIAGNOSIS — S92009A Unspecified fracture of unspecified calcaneus, initial encounter for closed fracture: Secondary | ICD-10-CM

## 2018-03-30 DIAGNOSIS — S52612A Displaced fracture of left ulna styloid process, initial encounter for closed fracture: Secondary | ICD-10-CM | POA: Diagnosis present

## 2018-03-30 DIAGNOSIS — Z23 Encounter for immunization: Secondary | ICD-10-CM

## 2018-03-30 DIAGNOSIS — W3400XA Accidental discharge from unspecified firearms or gun, initial encounter: Secondary | ICD-10-CM

## 2018-03-30 DIAGNOSIS — T148XXA Other injury of unspecified body region, initial encounter: Secondary | ICD-10-CM | POA: Diagnosis not present

## 2018-03-30 DIAGNOSIS — S61431A Puncture wound without foreign body of right hand, initial encounter: Secondary | ICD-10-CM | POA: Diagnosis present

## 2018-03-30 DIAGNOSIS — S62396A Other fracture of fifth metacarpal bone, right hand, initial encounter for closed fracture: Secondary | ICD-10-CM | POA: Diagnosis present

## 2018-03-30 DIAGNOSIS — Z833 Family history of diabetes mellitus: Secondary | ICD-10-CM

## 2018-03-30 DIAGNOSIS — S52531A Colles' fracture of right radius, initial encounter for closed fracture: Secondary | ICD-10-CM

## 2018-03-30 DIAGNOSIS — K219 Gastro-esophageal reflux disease without esophagitis: Secondary | ICD-10-CM | POA: Diagnosis present

## 2018-03-30 DIAGNOSIS — S92011A Displaced fracture of body of right calcaneus, initial encounter for closed fracture: Secondary | ICD-10-CM

## 2018-03-30 DIAGNOSIS — S62316A Displaced fracture of base of fifth metacarpal bone, right hand, initial encounter for closed fracture: Secondary | ICD-10-CM | POA: Diagnosis present

## 2018-03-30 DIAGNOSIS — F17210 Nicotine dependence, cigarettes, uncomplicated: Secondary | ICD-10-CM | POA: Diagnosis present

## 2018-03-30 DIAGNOSIS — Z803 Family history of malignant neoplasm of breast: Secondary | ICD-10-CM

## 2018-03-30 DIAGNOSIS — Z800 Family history of malignant neoplasm of digestive organs: Secondary | ICD-10-CM

## 2018-03-30 DIAGNOSIS — S62316B Displaced fracture of base of fifth metacarpal bone, right hand, initial encounter for open fracture: Secondary | ICD-10-CM

## 2018-03-30 DIAGNOSIS — I10 Essential (primary) hypertension: Secondary | ICD-10-CM | POA: Diagnosis present

## 2018-03-30 DIAGNOSIS — Z8249 Family history of ischemic heart disease and other diseases of the circulatory system: Secondary | ICD-10-CM

## 2018-03-30 DIAGNOSIS — S62121A Displaced fracture of lunate [semilunar], right wrist, initial encounter for closed fracture: Secondary | ICD-10-CM | POA: Diagnosis present

## 2018-03-30 DIAGNOSIS — S62141A Displaced fracture of body of hamate [unciform] bone, right wrist, initial encounter for closed fracture: Secondary | ICD-10-CM | POA: Diagnosis present

## 2018-03-30 DIAGNOSIS — S61439A Puncture wound without foreign body of unspecified hand, initial encounter: Secondary | ICD-10-CM

## 2018-03-30 DIAGNOSIS — S52502A Unspecified fracture of the lower end of left radius, initial encounter for closed fracture: Secondary | ICD-10-CM | POA: Diagnosis present

## 2018-03-30 HISTORY — PX: ORIF WRIST FRACTURE: SHX2133

## 2018-03-30 HISTORY — DX: Accidental discharge from unspecified firearms or gun, initial encounter: W34.00XA

## 2018-03-30 HISTORY — PX: I & D EXTREMITY: SHX5045

## 2018-03-30 HISTORY — DX: Unspecified fracture of unspecified calcaneus, initial encounter for closed fracture: S92.009A

## 2018-03-30 HISTORY — PX: CAST APPLICATION: SHX380

## 2018-03-30 LAB — CBC WITH DIFFERENTIAL/PLATELET
Abs Immature Granulocytes: 0.2 10*3/uL — ABNORMAL HIGH (ref 0.0–0.1)
BASOS PCT: 0 %
Basophils Absolute: 0 10*3/uL (ref 0.0–0.1)
EOS ABS: 0.1 10*3/uL (ref 0.0–0.7)
EOS PCT: 1 %
HEMATOCRIT: 36.2 % — AB (ref 39.0–52.0)
Hemoglobin: 11.6 g/dL — ABNORMAL LOW (ref 13.0–17.0)
IMMATURE GRANULOCYTES: 2 %
LYMPHS ABS: 3.3 10*3/uL (ref 0.7–4.0)
Lymphocytes Relative: 33 %
MCH: 27.6 pg (ref 26.0–34.0)
MCHC: 32 g/dL (ref 30.0–36.0)
MCV: 86 fL (ref 78.0–100.0)
MONOS PCT: 6 %
Monocytes Absolute: 0.6 10*3/uL (ref 0.1–1.0)
NEUTROS PCT: 58 %
Neutro Abs: 5.8 10*3/uL (ref 1.7–7.7)
PLATELETS: 270 10*3/uL (ref 150–400)
RBC: 4.21 MIL/uL — AB (ref 4.22–5.81)
RDW: 13.8 % (ref 11.5–15.5)
WBC: 10 10*3/uL (ref 4.0–10.5)

## 2018-03-30 LAB — BASIC METABOLIC PANEL
ANION GAP: 13 (ref 5–15)
BUN: 9 mg/dL (ref 6–20)
CALCIUM: 8.7 mg/dL — AB (ref 8.9–10.3)
CO2: 21 mmol/L — AB (ref 22–32)
CREATININE: 1.3 mg/dL — AB (ref 0.61–1.24)
Chloride: 108 mmol/L (ref 101–111)
GFR calc Af Amer: >60 mL/min (ref >60)
GLUCOSE: 158 mg/dL — AB (ref 65–99)
Potassium: 3 mmol/L — ABNORMAL LOW (ref 3.5–5.1)
Sodium: 142 mmol/L (ref 135–145)

## 2018-03-30 LAB — TYPE AND SCREEN
ABO/RH(D): O POS
Antibody Screen: NEGATIVE

## 2018-03-30 LAB — ABO/RH: ABO/RH(D): O POS

## 2018-03-30 SURGERY — IRRIGATION AND DEBRIDEMENT EXTREMITY
Anesthesia: General | Site: Wrist | Laterality: Right

## 2018-03-30 MED ORDER — BUPIVACAINE HCL (PF) 0.25 % IJ SOLN
INTRAMUSCULAR | Status: DC | PRN
  Administered 2018-03-30: .1 mL

## 2018-03-30 MED ORDER — CEFAZOLIN SODIUM-DEXTROSE 2-4 GM/100ML-% IV SOLN
2.0000 g | Freq: Once | INTRAVENOUS | Status: AC
  Administered 2018-03-30: 2 g via INTRAVENOUS
  Filled 2018-03-30: qty 100

## 2018-03-30 MED ORDER — OXYCODONE HCL 5 MG/5ML PO SOLN: 5.0000 mg | Freq: Once | ORAL | Status: DC | PRN

## 2018-03-30 MED ORDER — LACTATED RINGERS IV SOLN
INTRAVENOUS | Status: DC | PRN
  Administered 2018-03-30 (×2): via INTRAVENOUS

## 2018-03-30 MED ORDER — MIDAZOLAM HCL 5 MG/5ML IJ SOLN
INTRAMUSCULAR | Status: DC | PRN
  Administered 2018-03-30: 2 mg via INTRAVENOUS

## 2018-03-30 MED ORDER — EPHEDRINE SULFATE 50 MG/ML IJ SOLN
INTRAMUSCULAR | Status: DC | PRN
  Administered 2018-03-30: 10 mg via INTRAVENOUS

## 2018-03-30 MED ORDER — FENTANYL CITRATE (PF) 100 MCG/2ML IJ SOLN
100.0000 ug | Freq: Once | INTRAMUSCULAR | Status: AC
  Administered 2018-03-30: 100 ug via INTRAVENOUS

## 2018-03-30 MED ORDER — FENTANYL CITRATE (PF) 100 MCG/2ML IJ SOLN
INTRAMUSCULAR | Status: AC
  Filled 2018-03-30: qty 2

## 2018-03-30 MED ORDER — HYDROMORPHONE HCL 2 MG/ML IJ SOLN
INTRAMUSCULAR | Status: AC
  Administered 2018-03-30: 0.5 mg via INTRAVENOUS
  Filled 2018-03-30: qty 1

## 2018-03-30 MED ORDER — FENTANYL CITRATE (PF) 100 MCG/2ML IJ SOLN
INTRAMUSCULAR | Status: DC | PRN
  Administered 2018-03-30: 50 ug via INTRAVENOUS
  Administered 2018-03-30 (×5): 25 ug via INTRAVENOUS
  Administered 2018-03-30: 50 ug via INTRAVENOUS
  Administered 2018-03-30: 25 ug via INTRAVENOUS

## 2018-03-30 MED ORDER — SODIUM CHLORIDE 0.9 % IV BOLUS
1000.0000 mL | Freq: Once | INTRAVENOUS | Status: AC
  Administered 2018-03-30: 1000 mL via INTRAVENOUS

## 2018-03-30 MED ORDER — TETANUS-DIPHTH-ACELL PERTUSSIS 5-2.5-18.5 LF-MCG/0.5 IM SUSP
INTRAMUSCULAR | Status: AC
  Filled 2018-03-30: qty 0.5

## 2018-03-30 MED ORDER — ONDANSETRON HCL 4 MG/2ML IJ SOLN
4.0000 mg | Freq: Once | INTRAMUSCULAR | Status: AC
  Administered 2018-03-30: 4 mg via INTRAVENOUS

## 2018-03-30 MED ORDER — HYDROMORPHONE HCL 2 MG/ML IJ SOLN
1.0000 mg | Freq: Once | INTRAMUSCULAR | Status: AC
  Administered 2018-03-30: 1 mg via INTRAVENOUS
  Filled 2018-03-30: qty 1

## 2018-03-30 MED ORDER — CEFAZOLIN SODIUM-DEXTROSE 2-4 GM/100ML-% IV SOLN: 2.0000 g | INTRAVENOUS | Status: DC

## 2018-03-30 MED ORDER — TETANUS-DIPHTH-ACELL PERTUSSIS 5-2.5-18.5 LF-MCG/0.5 IM SUSP
0.5000 mL | Freq: Once | INTRAMUSCULAR | Status: AC
  Administered 2018-03-30: 0.5 mL via INTRAMUSCULAR

## 2018-03-30 MED ORDER — CEFAZOLIN SODIUM-DEXTROSE 1-4 GM/50ML-% IV SOLN
1.0000 g | Freq: Three times a day (TID) | INTRAVENOUS | Status: DC
  Administered 2018-03-31 – 2018-04-02 (×8): 1 g via INTRAVENOUS
  Administered 2018-04-02: 2 g via INTRAVENOUS
  Filled 2018-03-30 (×9): qty 50

## 2018-03-30 MED ORDER — ONDANSETRON HCL 4 MG/2ML IJ SOLN
4.0000 mg | Freq: Four times a day (QID) | INTRAMUSCULAR | Status: DC | PRN
  Administered 2018-03-31: 4 mg via INTRAVENOUS
  Filled 2018-03-30: qty 2

## 2018-03-30 MED ORDER — 0.9 % SODIUM CHLORIDE (POUR BTL) OPTIME
TOPICAL | Status: DC | PRN
  Administered 2018-03-30: 1000 mL

## 2018-03-30 MED ORDER — OXYCODONE HCL 5 MG PO TABS: 5.0000 mg | ORAL_TABLET | Freq: Once | ORAL | Status: DC | PRN

## 2018-03-30 MED ORDER — METHOCARBAMOL 500 MG PO TABS
500.0000 mg | ORAL_TABLET | Freq: Four times a day (QID) | ORAL | Status: DC | PRN
  Administered 2018-04-02 – 2018-04-04 (×5): 500 mg via ORAL
  Filled 2018-03-30 (×6): qty 1

## 2018-03-30 MED ORDER — FENTANYL CITRATE (PF) 250 MCG/5ML IJ SOLN
INTRAMUSCULAR | Status: AC
  Filled 2018-03-30: qty 5

## 2018-03-30 MED ORDER — CHLORHEXIDINE GLUCONATE 4 % EX LIQD: 60.0000 mL | Freq: Once | CUTANEOUS | Status: DC

## 2018-03-30 MED ORDER — ONDANSETRON HCL 4 MG/2ML IJ SOLN
INTRAMUSCULAR | Status: AC
  Filled 2018-03-30: qty 2

## 2018-03-30 MED ORDER — KCL IN DEXTROSE-NACL 20-5-0.45 MEQ/L-%-% IV SOLN
INTRAVENOUS | Status: DC
  Administered 2018-03-30 – 2018-04-01 (×3): via INTRAVENOUS
  Filled 2018-03-30 (×4): qty 1000

## 2018-03-30 MED ORDER — ONDANSETRON HCL 4 MG PO TABS: 4.0000 mg | ORAL_TABLET | Freq: Four times a day (QID) | ORAL | Status: DC | PRN

## 2018-03-30 MED ORDER — DEXAMETHASONE SODIUM PHOSPHATE 10 MG/ML IJ SOLN
INTRAMUSCULAR | Status: DC | PRN
  Administered 2018-03-30: 10 mg via INTRAVENOUS

## 2018-03-30 MED ORDER — DIPHENHYDRAMINE HCL 25 MG PO CAPS
25.0000 mg | ORAL_CAPSULE | Freq: Four times a day (QID) | ORAL | Status: DC | PRN
  Administered 2018-03-31 – 2018-04-01 (×4): 25 mg via ORAL
  Administered 2018-04-01: 50 mg via ORAL
  Filled 2018-03-30 (×2): qty 1
  Filled 2018-03-30: qty 2
  Filled 2018-03-30 (×2): qty 1

## 2018-03-30 MED ORDER — SUCCINYLCHOLINE CHLORIDE 200 MG/10ML IV SOSY
PREFILLED_SYRINGE | INTRAVENOUS | Status: AC
  Filled 2018-03-30: qty 10

## 2018-03-30 MED ORDER — DOCUSATE SODIUM 100 MG PO CAPS
100.0000 mg | ORAL_CAPSULE | Freq: Two times a day (BID) | ORAL | Status: DC
Start: 2018-03-30 — End: 2018-04-04
  Administered 2018-03-31 – 2018-04-04 (×9): 100 mg via ORAL
  Filled 2018-03-30 (×9): qty 1

## 2018-03-30 MED ORDER — OXYCODONE HCL 5 MG PO TABS
5.0000 mg | ORAL_TABLET | ORAL | Status: DC | PRN
  Administered 2018-03-31: 10 mg via ORAL
  Filled 2018-03-30: qty 2

## 2018-03-30 MED ORDER — HYDROMORPHONE HCL 1 MG/ML IJ SOLN: 0.5000 mg | INTRAMUSCULAR | Status: DC | PRN

## 2018-03-30 MED ORDER — ENOXAPARIN SODIUM 40 MG/0.4ML ~~LOC~~ SOLN
40.0000 mg | SUBCUTANEOUS | Status: DC
  Administered 2018-03-31 – 2018-04-01 (×2): 40 mg via SUBCUTANEOUS
  Filled 2018-03-30 (×2): qty 0.4

## 2018-03-30 MED ORDER — CEFAZOLIN SODIUM-DEXTROSE 2-3 GM-%(50ML) IV SOLR
INTRAVENOUS | Status: DC | PRN
  Administered 2018-03-30: 2 g via INTRAVENOUS

## 2018-03-30 MED ORDER — CEFAZOLIN SODIUM-DEXTROSE 1-4 GM/50ML-% IV SOLN
1.0000 g | INTRAVENOUS | Status: AC
  Administered 2018-03-30: 1 g via INTRAVENOUS
  Filled 2018-03-30: qty 50

## 2018-03-30 MED ORDER — HYDROCODONE-ACETAMINOPHEN 7.5-325 MG PO TABS
1.0000 | ORAL_TABLET | ORAL | Status: DC | PRN
  Administered 2018-03-31 – 2018-04-01 (×4): 2 via ORAL
  Filled 2018-03-30 (×4): qty 2

## 2018-03-30 MED ORDER — HYDROMORPHONE HCL 2 MG/ML IJ SOLN
0.2500 mg | INTRAMUSCULAR | Status: DC | PRN
  Administered 2018-03-30 (×2): 0.5 mg via INTRAVENOUS

## 2018-03-30 MED ORDER — LIDOCAINE HCL (CARDIAC) PF 100 MG/5ML IV SOSY
PREFILLED_SYRINGE | INTRAVENOUS | Status: DC | PRN
  Administered 2018-03-30: 60 mg via INTRAVENOUS

## 2018-03-30 MED ORDER — LIDOCAINE 2% (20 MG/ML) 5 ML SYRINGE
INTRAMUSCULAR | Status: AC
  Filled 2018-03-30: qty 5

## 2018-03-30 MED ORDER — DEXAMETHASONE SODIUM PHOSPHATE 10 MG/ML IJ SOLN
INTRAMUSCULAR | Status: AC
  Filled 2018-03-30: qty 1

## 2018-03-30 MED ORDER — VITAMIN C 500 MG PO TABS
1000.0000 mg | ORAL_TABLET | Freq: Every day | ORAL | Status: DC
  Administered 2018-03-31 – 2018-04-04 (×5): 1000 mg via ORAL
  Filled 2018-03-30 (×5): qty 2

## 2018-03-30 MED ORDER — ONDANSETRON HCL 4 MG/2ML IJ SOLN
INTRAMUSCULAR | Status: DC | PRN
  Administered 2018-03-30: 4 mg via INTRAVENOUS

## 2018-03-30 MED ORDER — PROPOFOL 10 MG/ML IV BOLUS
INTRAVENOUS | Status: AC
  Filled 2018-03-30: qty 20

## 2018-03-30 MED ORDER — MIDAZOLAM HCL 2 MG/2ML IJ SOLN
INTRAMUSCULAR | Status: AC
  Filled 2018-03-30: qty 2

## 2018-03-30 MED ORDER — BUPIVACAINE HCL (PF) 0.25 % IJ SOLN
INTRAMUSCULAR | Status: AC
  Filled 2018-03-30: qty 30

## 2018-03-30 MED ORDER — CEFAZOLIN SODIUM-DEXTROSE 2-4 GM/100ML-% IV SOLN
INTRAVENOUS | Status: AC
  Filled 2018-03-30: qty 100

## 2018-03-30 MED ORDER — HYDROMORPHONE HCL 2 MG/ML IJ SOLN
0.5000 mg | INTRAMUSCULAR | Status: DC | PRN
  Administered 2018-03-30 – 2018-04-02 (×14): 1 mg via INTRAVENOUS
  Filled 2018-03-30 (×14): qty 1

## 2018-03-30 MED ORDER — DEXMEDETOMIDINE HCL 200 MCG/2ML IV SOLN
INTRAVENOUS | Status: DC | PRN
  Administered 2018-03-30 (×2): 12 ug via INTRAVENOUS
  Administered 2018-03-30: 4 ug via INTRAVENOUS

## 2018-03-30 MED ORDER — ADULT MULTIVITAMIN W/MINERALS CH
1.0000 | ORAL_TABLET | Freq: Every day | ORAL | Status: DC
  Administered 2018-03-31 – 2018-04-04 (×5): 1 via ORAL
  Filled 2018-03-30 (×5): qty 1

## 2018-03-30 MED ORDER — METHOCARBAMOL 1000 MG/10ML IJ SOLN
500.0000 mg | Freq: Four times a day (QID) | INTRAVENOUS | Status: DC | PRN
  Filled 2018-03-30: qty 5

## 2018-03-30 MED ORDER — PROPOFOL 10 MG/ML IV BOLUS
INTRAVENOUS | Status: DC | PRN
  Administered 2018-03-30: 200 mg via INTRAVENOUS

## 2018-03-30 MED ORDER — PROMETHAZINE HCL 25 MG/ML IJ SOLN: 6.2500 mg | INTRAMUSCULAR | Status: DC | PRN

## 2018-03-30 SURGICAL SUPPLY — 72 items
BANDAGE ACE 3X5.8 VEL STRL LF (GAUZE/BANDAGES/DRESSINGS) ×5 IMPLANT
BANDAGE ACE 4X5 VEL STRL LF (GAUZE/BANDAGES/DRESSINGS) ×5 IMPLANT
BIT DRILL 2.2 SS TIBIAL (BIT) ×5 IMPLANT
BNDG COHESIVE 1X5 TAN STRL LF (GAUZE/BANDAGES/DRESSINGS) IMPLANT
BNDG CONFORM 2 STRL LF (GAUZE/BANDAGES/DRESSINGS) IMPLANT
BNDG ESMARK 4X9 LF (GAUZE/BANDAGES/DRESSINGS) ×10 IMPLANT
BNDG GAUZE ELAST 4 BULKY (GAUZE/BANDAGES/DRESSINGS) ×5 IMPLANT
CORDS BIPOLAR (ELECTRODE) IMPLANT
COVER SURGICAL LIGHT HANDLE (MISCELLANEOUS) ×5 IMPLANT
CUFF TOURNIQUET SINGLE 18IN (TOURNIQUET CUFF) ×5 IMPLANT
CUFF TOURNIQUET SINGLE 24IN (TOURNIQUET CUFF) IMPLANT
DRAIN PENROSE 1/4X12 LTX STRL (WOUND CARE) IMPLANT
DRAPE SURG 17X23 STRL (DRAPES) ×5 IMPLANT
DRSG ADAPTIC 3X8 NADH LF (GAUZE/BANDAGES/DRESSINGS) ×5 IMPLANT
ELECT REM PT RETURN 9FT ADLT (ELECTROSURGICAL)
ELECTRODE REM PT RTRN 9FT ADLT (ELECTROSURGICAL) IMPLANT
GAUZE SPONGE 4X4 12PLY STRL (GAUZE/BANDAGES/DRESSINGS) ×5 IMPLANT
GAUZE XEROFORM 1X8 LF (GAUZE/BANDAGES/DRESSINGS) ×5 IMPLANT
GAUZE XEROFORM 5X9 LF (GAUZE/BANDAGES/DRESSINGS) IMPLANT
GLOVE BIOGEL PI IND STRL 8.5 (GLOVE) ×3 IMPLANT
GLOVE BIOGEL PI INDICATOR 8.5 (GLOVE) ×2
GLOVE SURG ORTHO 8.0 STRL STRW (GLOVE) ×5 IMPLANT
GOWN STRL REUS W/ TWL LRG LVL3 (GOWN DISPOSABLE) ×9 IMPLANT
GOWN STRL REUS W/ TWL XL LVL3 (GOWN DISPOSABLE) ×3 IMPLANT
GOWN STRL REUS W/TWL LRG LVL3 (GOWN DISPOSABLE) ×6
GOWN STRL REUS W/TWL XL LVL3 (GOWN DISPOSABLE) ×2
HANDPIECE INTERPULSE COAX TIP (DISPOSABLE)
K-WIRE .045X4 (WIRE) ×15 IMPLANT
K-WIRE 1.6 (WIRE) ×4
K-WIRE FX5X1.6XNS BN SS (WIRE) ×6
KIT BASIN OR (CUSTOM PROCEDURE TRAY) ×5 IMPLANT
KIT TURNOVER KIT B (KITS) ×5 IMPLANT
KWIRE FX5X1.6XNS BN SS (WIRE) ×6 IMPLANT
MANIFOLD NEPTUNE II (INSTRUMENTS) ×5 IMPLANT
NEEDLE HYPO 25GX1X1/2 BEV (NEEDLE) IMPLANT
NS IRRIG 1000ML POUR BTL (IV SOLUTION) ×5 IMPLANT
PACK ORTHO EXTREMITY (CUSTOM PROCEDURE TRAY) ×5 IMPLANT
PAD ARMBOARD 7.5X6 YLW CONV (MISCELLANEOUS) ×10 IMPLANT
PAD CAST 4YDX4 CTTN HI CHSV (CAST SUPPLIES) ×3 IMPLANT
PADDING CAST COTTON 4X4 STRL (CAST SUPPLIES) ×2
PEG LOCKING SMOOTH 2.2X20 (Screw) ×5 IMPLANT
PEG LOCKING SMOOTH 2.2X22 (Screw) ×15 IMPLANT
PEG LOCKING SMOOTH 2.2X24 (Peg) ×15 IMPLANT
PLATE STANDARD DVR LEFT (Plate) ×5 IMPLANT
PLATE STD DVR LT 24X51 (Plate) ×3 IMPLANT
SCREW LOCK 16X2.7X 3 LD TPR (Screw) ×9 IMPLANT
SCREW LOCKING 2.7X15MM (Screw) ×10 IMPLANT
SCREW LOCKING 2.7X16 (Screw) ×6 IMPLANT
SET CYSTO W/LG BORE CLAMP LF (SET/KITS/TRAYS/PACK) IMPLANT
SET HNDPC FAN SPRY TIP SCT (DISPOSABLE) IMPLANT
SOAP 2 % CHG 4 OZ (WOUND CARE) ×5 IMPLANT
SPONGE LAP 18X18 X RAY DECT (DISPOSABLE) ×5 IMPLANT
SPONGE LAP 4X18 X RAY DECT (DISPOSABLE) ×5 IMPLANT
SUCTION FRAZIER HANDLE 10FR (MISCELLANEOUS) ×2
SUCTION TUBE FRAZIER 10FR DISP (MISCELLANEOUS) ×3 IMPLANT
SUT ETHILON 4 0 PS 2 18 (SUTURE) IMPLANT
SUT ETHILON 5 0 P 3 18 (SUTURE)
SUT NYLON ETHILON 5-0 P-3 1X18 (SUTURE) IMPLANT
SUT PROLENE 3 0 PS 2 (SUTURE) ×10 IMPLANT
SUT PROLENE 4 0 PS 2 18 (SUTURE) ×10 IMPLANT
SUT VIC AB 2-0 FS1 27 (SUTURE) ×5 IMPLANT
SUT VIC AB 4-0 PS2 27 (SUTURE) ×5 IMPLANT
SWAB COLLECTION DEVICE MRSA (MISCELLANEOUS) ×5 IMPLANT
SWAB CULTURE ESWAB REG 1ML (MISCELLANEOUS) IMPLANT
SYR CONTROL 10ML LL (SYRINGE) IMPLANT
TOWEL OR 17X24 6PK STRL BLUE (TOWEL DISPOSABLE) ×5 IMPLANT
TOWEL OR 17X26 10 PK STRL BLUE (TOWEL DISPOSABLE) ×5 IMPLANT
TUBE CONNECTING 12'X1/4 (SUCTIONS) ×1
TUBE CONNECTING 12X1/4 (SUCTIONS) ×4 IMPLANT
UNDERPAD 30X30 (UNDERPADS AND DIAPERS) ×5 IMPLANT
WATER STERILE IRR 1000ML POUR (IV SOLUTION) ×5 IMPLANT
YANKAUER SUCT BULB TIP NO VENT (SUCTIONS) ×5 IMPLANT

## 2018-03-30 NOTE — ED Notes (Signed)
Pt returned from xray

## 2018-03-30 NOTE — Anesthesia Preprocedure Evaluation (Signed)
Anesthesia Evaluation  Patient identified by MRN, date of birth, ID band Patient awake    Reviewed: Allergy & Precautions, NPO status , Patient's Chart, lab work & pertinent test results  Airway Mallampati: II  TM Distance: >3 FB Neck ROM: Full    Dental no notable dental hx.    Pulmonary Current Smoker,    Pulmonary exam normal breath sounds clear to auscultation       Cardiovascular hypertension, Normal cardiovascular exam Rhythm:Regular Rate:Normal     Neuro/Psych negative neurological ROS  negative psych ROS   GI/Hepatic Neg liver ROS, GERD  ,  Endo/Other  negative endocrine ROS  Renal/GU negative Renal ROS  negative genitourinary   Musculoskeletal negative musculoskeletal ROS (+)   Abdominal   Peds negative pediatric ROS (+)  Hematology negative hematology ROS (+)   Anesthesia Other Findings   Reproductive/Obstetrics negative OB ROS                             Anesthesia Physical Anesthesia Plan  ASA: II and emergent  Anesthesia Plan: General   Post-op Pain Management:    Induction: Intravenous and Rapid sequence  PONV Risk Score and Plan: 2 and Ondansetron, Dexamethasone and Treatment may vary due to age or medical condition  Airway Management Planned: Oral ETT  Additional Equipment:   Intra-op Plan:   Post-operative Plan: Extubation in OR  Informed Consent: I have reviewed the patients History and Physical, chart, labs and discussed the procedure including the risks, benefits and alternatives for the proposed anesthesia with the patient or authorized representative who has indicated his/her understanding and acceptance.   Dental advisory given  Plan Discussed with: CRNA and Surgeon  Anesthesia Plan Comments:         Anesthesia Quick Evaluation
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
## 2018-03-30 NOTE — Progress Notes (Signed)
Pt asking RN if he can go out and get some "fresh air". Pt admits to being a smoker. RN educated pt that pt is not allowed to be out from the floor without doctor's order and how smoking may affect his recovery from his injuries and hospital is a non-smoking place. Dr.Ortmann notified and told RN that he will see the pt in the morning and talk about this issues with him. Nursing will continue to monitor.

## 2018-03-30 NOTE — Anesthesia Postprocedure Evaluation (Signed)
Anesthesia Post Note  Patient: Eric Parsons  Procedure(s) Performed: IRRIGATION AND DEBRIDEMENT AND PINNING RIGHT WRIST (Right Wrist) OPEN REDUCTION INTERNAL FIXATION (ORIF) WRIST FRACTURE (Left Wrist) SPLINTING OF RIGHT ANKLE (Right Ankle)     Patient location during evaluation: PACU Anesthesia Type: General Level of consciousness: awake and alert Pain management: pain level controlled Vital Signs Assessment: post-procedure vital signs reviewed and stable Respiratory status: spontaneous breathing, nonlabored ventilation, respiratory function stable and patient connected to nasal cannula oxygen Cardiovascular status: blood pressure returned to baseline and stable Postop Assessment: no apparent nausea or vomiting Anesthetic complications: no    Last Vitals:  Vitals:   03/30/18 1645 03/30/18 1653  BP: (!) 145/101 (!) 159/93  Pulse: 76 64  Resp: 16 20  Temp:    SpO2: 98% 99%    Last Pain:  Vitals:   03/30/18 1653  TempSrc:   PainSc: 7                  Ailed Defibaugh S
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
## 2018-03-30 NOTE — ED Notes (Signed)
Patient transported to X-ray 
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
## 2018-03-30 NOTE — Anesthesia Procedure Notes (Signed)
Procedure Name: LMA Insertion Date/Time: 03/30/2018 2:08 PM Performed by: Lanell Matar, CRNA Pre-anesthesia Checklist: Patient identified, Emergency Drugs available, Suction available and Patient being monitored Patient Re-evaluated:Patient Re-evaluated prior to induction Oxygen Delivery Method: Circle System Utilized Preoxygenation: Pre-oxygenation with 100% oxygen Induction Type: IV induction Ventilation: Mask ventilation without difficulty LMA: LMA inserted LMA Size: 4.0 Number of attempts: 1 Airway Equipment and Method: Bite block Placement Confirmation: positive ETCO2 Tube secured with: Tape Dental Injury: Teeth and Oropharynx as per pre-operative assessment

## 2018-03-30 NOTE — Transfer of Care (Signed)
Immediate Anesthesia Transfer of Care Note  Patient: Eric Parsons  Procedure(s) Performed: IRRIGATION AND DEBRIDEMENT AND PINNING RIGHT WRIST (Right Wrist) OPEN REDUCTION INTERNAL FIXATION (ORIF) WRIST FRACTURE (Left Wrist) SPLINTING OF RIGHT ANKLE (Right Ankle)  Patient Location: PACU  Anesthesia Type:General  Level of Consciousness: awake, alert  and oriented  Airway & Oxygen Therapy: Patient Spontanous Breathing  Post-op Assessment: Post -op Vital signs reviewed and stable  Post vital signs: stable  Last Vitals:  Vitals Value Taken Time  BP 170/116 03/30/2018  4:13 PM  Temp    Pulse 89 03/30/2018  4:18 PM  Resp 14 03/30/2018  4:18 PM  SpO2 98 % 03/30/2018  4:18 PM  Vitals shown include unvalidated device data.  Last Pain:  Vitals:   03/30/18 1324  TempSrc:   PainSc: 7          Complications: No apparent anesthesia complications

## 2018-03-31 DIAGNOSIS — S61431A Puncture wound without foreign body of right hand, initial encounter: Secondary | ICD-10-CM | POA: Diagnosis present

## 2018-03-31 DIAGNOSIS — F17210 Nicotine dependence, cigarettes, uncomplicated: Secondary | ICD-10-CM | POA: Diagnosis present

## 2018-03-31 DIAGNOSIS — S52531A Colles' fracture of right radius, initial encounter for closed fracture: Secondary | ICD-10-CM | POA: Diagnosis present

## 2018-03-31 DIAGNOSIS — Z8249 Family history of ischemic heart disease and other diseases of the circulatory system: Secondary | ICD-10-CM | POA: Diagnosis not present

## 2018-03-31 DIAGNOSIS — I10 Essential (primary) hypertension: Secondary | ICD-10-CM | POA: Diagnosis present

## 2018-03-31 DIAGNOSIS — S62141A Displaced fracture of body of hamate [unciform] bone, right wrist, initial encounter for closed fracture: Secondary | ICD-10-CM | POA: Diagnosis present

## 2018-03-31 DIAGNOSIS — T148XXA Other injury of unspecified body region, initial encounter: Secondary | ICD-10-CM | POA: Diagnosis present

## 2018-03-31 DIAGNOSIS — S92011A Displaced fracture of body of right calcaneus, initial encounter for closed fracture: Secondary | ICD-10-CM | POA: Diagnosis present

## 2018-03-31 DIAGNOSIS — S61439A Puncture wound without foreign body of unspecified hand, initial encounter: Secondary | ICD-10-CM

## 2018-03-31 DIAGNOSIS — Z833 Family history of diabetes mellitus: Secondary | ICD-10-CM | POA: Diagnosis not present

## 2018-03-31 DIAGNOSIS — S52612A Displaced fracture of left ulna styloid process, initial encounter for closed fracture: Secondary | ICD-10-CM | POA: Diagnosis present

## 2018-03-31 DIAGNOSIS — S62121A Displaced fracture of lunate [semilunar], right wrist, initial encounter for closed fracture: Secondary | ICD-10-CM | POA: Diagnosis present

## 2018-03-31 DIAGNOSIS — W3400XA Accidental discharge from unspecified firearms or gun, initial encounter: Secondary | ICD-10-CM | POA: Diagnosis not present

## 2018-03-31 DIAGNOSIS — K219 Gastro-esophageal reflux disease without esophagitis: Secondary | ICD-10-CM | POA: Diagnosis present

## 2018-03-31 DIAGNOSIS — Z23 Encounter for immunization: Secondary | ICD-10-CM | POA: Diagnosis not present

## 2018-03-31 DIAGNOSIS — S62396A Other fracture of fifth metacarpal bone, right hand, initial encounter for closed fracture: Secondary | ICD-10-CM | POA: Diagnosis present

## 2018-03-31 DIAGNOSIS — Z803 Family history of malignant neoplasm of breast: Secondary | ICD-10-CM | POA: Diagnosis not present

## 2018-03-31 DIAGNOSIS — Z800 Family history of malignant neoplasm of digestive organs: Secondary | ICD-10-CM | POA: Diagnosis not present

## 2018-03-31 DIAGNOSIS — S52502A Unspecified fracture of the lower end of left radius, initial encounter for closed fracture: Secondary | ICD-10-CM | POA: Diagnosis present

## 2018-03-31 NOTE — Progress Notes (Signed)
Orthopedics Progress Note  Subjective: Patient complaining of itching in the splints.  He is not having much heel pain while resting in bed.  Objective:  Vitals:   03/31/18 0452 03/31/18 0630  BP: (!) 184/100 (!) 162/86  Pulse: 94   Resp: 16   Temp: 98.2 F (36.8 C)   SpO2: 100%     General: Awake and alert  Musculoskeletal: Right LE elevated and splinted. NVI Right LE.  Wiggles toes. Wiggles fingers bilateral hands, elevated and splinted Neurovascularly intact  Lab Results  Component Value Date   WBC 10.0 03/30/2018   HGB 11.6 (L) 03/30/2018   HCT 36.2 (L) 03/30/2018   MCV 86.0 03/30/2018   PLT 270 03/30/2018       Component Value Date/Time   NA 142 03/30/2018 1047   K 3.0 (L) 03/30/2018 1047   CL 108 03/30/2018 1047   CO2 21 (L) 03/30/2018 1047   GLUCOSE 158 (H) 03/30/2018 1047   BUN 9 03/30/2018 1047   CREATININE 1.30 (H) 03/30/2018 1047   CALCIUM 8.7 (L) 03/30/2018 1047   GFRNONAA >60 03/30/2018 1047   GFRAA >60 03/30/2018 1047    No results found for: INR, PROTIME  Assessment/Plan: POD #1 s/p Procedure(s): IRRIGATION AND DEBRIDEMENT AND PINNING RIGHT WRIST OPEN REDUCTION INTERNAL FIXATION (ORIF) WRIST FRACTURE SPLINTING OF RIGHT ANKLE Right comminuted calcaneus fracture.  Will review imaging with Dr Victorino Dike, ortho foot and ankle, to see if non surgical management vs ORIF.  Tongue fragment is somewhat displaced. Posterior facet looks good and minimal heel varus and widening. Strict NWB for now. Discharge planning, may need SNF depending upon what therapy says.  Almedia Balls. Ranell Patrick, MD 03/31/2018 1:44 PM
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
## 2018-03-31 NOTE — Plan of Care (Signed)
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
## 2018-03-31 NOTE — Progress Notes (Signed)
Subjective: 1 Day Post-Op Procedure(s) (LRB): IRRIGATION AND DEBRIDEMENT AND PINNING RIGHT WRIST (Right) OPEN REDUCTION INTERNAL FIXATION (ORIF) WRIST FRACTURE (Left) SPLINTING OF RIGHT ANKLE (Right) Patient reports pain as 8 on 0-10 scale.   He is laying comfortably in bed. His pain is fairly well-controlled with the pain medicine.  He has tolerated food and liquid without difficulty.  He denies numbness, chest pain, shortness of breath, fever, chills, nausea, vomiting, or diarrhea.  Objective: Vital signs in last 24 hours: Temp:  [97.5 F (36.4 C)-98.6 F (37 C)] 97.7 F (36.5 C) (05/28 1437) Pulse Rate:  [58-94] 58 (05/28 1437) Resp:  [14-26] 16 (05/28 1437) BP: (145-184)/(86-101) 160/97 (05/28 1437) SpO2:  [91 %-100 %] 100 % (05/28 1437)  Intake/Output from previous day: 05/27 0701 - 05/28 0700 In: 1740 [P.O.:240; I.V.:1400; IV Piggyback:100] Out: 2800 [Urine:2800] Intake/Output this shift: Total I/O In: 480 [P.O.:480] Out: 1100 [Urine:1100]  Recent Labs    03/30/18 1047  HGB 11.6*   Recent Labs    03/30/18 1047  WBC 10.0  RBC 4.21*  HCT 36.2*  PLT 270   Recent Labs    03/30/18 1047  NA 142  K 3.0*  CL 108  CO2 21*  BUN 9  CREATININE 1.30*  GLUCOSE 158*  CALCIUM 8.7*   No results for input(s): LABPT, INR in the last 72 hours.  Alert and oriented x 4 Sensation intact distally Intact pulses distally Splints are clean, dry, and intact. Splint of RLE, RUE, and LUE. Able to wiggle all fingers.   Assessment/Plan: 1 Day Post-Op Procedure(s) (LRB): IRRIGATION AND DEBRIDEMENT AND PINNING RIGHT WRIST (Right) OPEN REDUCTION INTERNAL FIXATION (ORIF) WRIST FRACTURE (Left) SPLINTING OF RIGHT ANKLE (Right) Advance diet  Continue with splints and keep them clean and dry.  Continue with pain management as needed.  Discussed SNF placement with the patient. He prefers to be discharged home since he has family that will be able to take care of him. We will see  what therapy says and determine discharge based on this.  Continue NWB of RLE.     Karma Greaser 03/31/2018, 4:20 PM

## 2018-04-01 ENCOUNTER — Encounter: Payer: Self-pay | Admitting: Internal Medicine

## 2018-04-01 ENCOUNTER — Other Ambulatory Visit: Payer: Self-pay

## 2018-04-01 ENCOUNTER — Encounter (HOSPITAL_COMMUNITY): Payer: Self-pay | Admitting: Orthopedic Surgery

## 2018-04-01 LAB — MRSA PCR SCREENING: MRSA BY PCR: NEGATIVE

## 2018-04-01 MED ORDER — CHLORHEXIDINE GLUCONATE 4 % EX LIQD
60.0000 mL | Freq: Once | CUTANEOUS | Status: DC
  Administered 2018-04-02: 4 via TOPICAL

## 2018-04-01 MED ORDER — POVIDONE-IODINE 10 % EX SWAB
2.0000 "application " | Freq: Once | CUTANEOUS | Status: DC
  Administered 2018-04-02: 2 via TOPICAL

## 2018-04-01 MED ORDER — LACTATED RINGERS IV SOLN
INTRAVENOUS | Status: DC
  Administered 2018-04-02 (×2): via INTRAVENOUS

## 2018-04-01 MED ORDER — CEFAZOLIN SODIUM-DEXTROSE 2-4 GM/100ML-% IV SOLN
2.0000 g | INTRAVENOUS | Status: DC
  Filled 2018-04-01: qty 100

## 2018-04-01 NOTE — Plan of Care (Signed)
  Problem: Clinical Measurements: Goal: Ability to maintain clinical measurements within normal limits will improve Outcome: Progressing   Problem: Clinical Measurements: Goal: Will remain free from infection Outcome: Progressing   Problem: Activity: Goal: Risk for activity intolerance will decrease Outcome: Progressing   Problem: Nutrition: Goal: Adequate nutrition will be maintained Outcome: Progressing   Problem: Pain Managment: Goal: General experience of comfort will improve Outcome: Progressing   

## 2018-04-01 NOTE — Progress Notes (Signed)
Subjective: 2 Days Post-Op Procedure(s) (LRB): IRRIGATION AND DEBRIDEMENT AND PINNING RIGHT WRIST (Right) OPEN REDUCTION INTERNAL FIXATION (ORIF) WRIST FRACTURE (Left) SPLINTING OF RIGHT ANKLE (Right) Patient reports pain as 8 on 0-10 scale.    Hands hurt more than tthe foot.  Objective: Vital signs in last 24 hours: Temp:  [97.7 F (36.5 C)-97.9 F (36.6 C)] 97.9 F (36.6 C) (05/28 2054) Pulse Rate:  [58-60] 60 (05/28 2054) Resp:  [16] 16 (05/28 2054) BP: (160-199)/(86-97) 199/86 (05/28 2054) SpO2:  [99 %-100 %] 99 % (05/28 2054)  Intake/Output from previous day: 05/28 0701 - 05/29 0700 In: 1345 [P.O.:720; I.V.:525; IV Piggyback:100] Out: 3550 [Urine:3550] Intake/Output this shift: Total I/O In: 240 [P.O.:240] Out: 300 [Urine:300]  Recent Labs    03/30/18 1047  HGB 11.6*   Recent Labs    03/30/18 1047  WBC 10.0  RBC 4.21*  HCT 36.2*  PLT 270   Recent Labs    03/30/18 1047  NA 142  K 3.0*  CL 108  CO2 21*  BUN 9  CREATININE 1.30*  GLUCOSE 158*  CALCIUM 8.7*   No results for input(s): LABPT, INR in the last 72 hours.      Assessment/Plan: 2 Days Post-Op Procedure(s) (LRB): IRRIGATION AND DEBRIDEMENT AND PINNING RIGHT WRIST (Right) OPEN REDUCTION INTERNAL FIXATION (ORIF) WRIST FRACTURE (Left) SPLINTING OF RIGHT ANKLE (Right)  I reviewed the cT scan and xrays of the right foot.  He has a disp;laced tongue type fracture that I believe requires operative treatment.  He's on the schedule for surgery tomorrow afternoon at [facility].  NPO after midnight.  Hold blood thinners.  Plan NWB for 6 weeks post op.  The patient unddrstands the plan and agrees.    Toni Arthurs 04/01/2018, 12:58 PM
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
## 2018-04-01 NOTE — Progress Notes (Signed)
Currently an inpatient on 5N, scheduled for ORIF Right calcaneous fx at [facility] tomorrow 04-02-18. Spoke with Aurelio Brash RN caring for pt today and informed him of NPO status and to hold Lovenox per Dr Hewitt's orders. Pt can receive Dexilant with sip water in am only. Reported to Greggory Stallion that [facility] will pick pt up at 1245 for transport and that we will call for report when pt has recovered from surgery.

## 2018-04-02 ENCOUNTER — Ambulatory Visit (HOSPITAL_BASED_OUTPATIENT_CLINIC_OR_DEPARTMENT_OTHER)
Admission: RE | Admit: 2018-04-02 | Payer: BLUE CROSS/BLUE SHIELD | Source: Ambulatory Visit | Admitting: Orthopedic Surgery

## 2018-04-02 ENCOUNTER — Inpatient Hospital Stay (HOSPITAL_BASED_OUTPATIENT_CLINIC_OR_DEPARTMENT_OTHER): Payer: BLUE CROSS/BLUE SHIELD | Admitting: Certified Registered"

## 2018-04-02 ENCOUNTER — Encounter (HOSPITAL_COMMUNITY): Payer: Self-pay | Admitting: Certified Registered"

## 2018-04-02 ENCOUNTER — Encounter (HOSPITAL_COMMUNITY)
Admission: EM | Disposition: A | Discharge status: Home / Self Care | Payer: Self-pay | Source: Home / Self Care | Attending: Orthopedic Surgery

## 2018-04-02 HISTORY — DX: Accidental discharge from unspecified firearms or gun, initial encounter: W34.00XA

## 2018-04-02 HISTORY — PX: OPEN REDUCTION, INTERNAL FIXATION (ORIF) CALCANEAL FRACTURE WITH FUSION: SHX5994

## 2018-04-02 HISTORY — DX: Unspecified fracture of unspecified calcaneus, initial encounter for closed fracture: S92.009A

## 2018-04-02 SURGERY — OPEN REDUCTION, INTERNAL FIXATION (ORIF) CALCANEAL FRACTURE WITH FUSION
Anesthesia: General | Site: Foot | Laterality: Right

## 2018-04-02 MED ORDER — LACTATED RINGERS IV SOLN: INTRAVENOUS | Status: DC

## 2018-04-02 MED ORDER — OXYCODONE HCL 5 MG PO TABS
5.0000 mg | ORAL_TABLET | ORAL | Status: DC | PRN
  Administered 2018-04-02 – 2018-04-03 (×3): 10 mg via ORAL
  Filled 2018-04-02 (×3): qty 2

## 2018-04-02 MED ORDER — FENTANYL CITRATE (PF) 100 MCG/2ML IJ SOLN
50.0000 ug | INTRAMUSCULAR | Status: DC | PRN
  Administered 2018-04-02: 100 ug via INTRAVENOUS

## 2018-04-02 MED ORDER — PROPOFOL 10 MG/ML IV BOLUS
INTRAVENOUS | Status: DC | PRN
  Administered 2018-04-02: 150 mg via INTRAVENOUS

## 2018-04-02 MED ORDER — ROPIVACAINE HCL 7.5 MG/ML IJ SOLN
INTRAMUSCULAR | Status: DC | PRN
  Administered 2018-04-02: 40 mL via PERINEURAL

## 2018-04-02 MED ORDER — CLONIDINE HCL (ANALGESIA) 100 MCG/ML EP SOLN
EPIDURAL | Status: DC | PRN
  Administered 2018-04-02: 80 ug

## 2018-04-02 MED ORDER — PROMETHAZINE HCL 25 MG/ML IJ SOLN: 6.2500 mg | INTRAMUSCULAR | Status: DC | PRN

## 2018-04-02 MED ORDER — DIPHENHYDRAMINE HCL 12.5 MG/5ML PO ELIX
12.5000 mg | ORAL_SOLUTION | ORAL | Status: DC | PRN
  Administered 2018-04-03 – 2018-04-04 (×6): 25 mg via ORAL
  Filled 2018-04-02 (×6): qty 10

## 2018-04-02 MED ORDER — OXYCODONE HCL 5 MG PO TABS
10.0000 mg | ORAL_TABLET | ORAL | Status: DC | PRN
  Administered 2018-04-03 – 2018-04-04 (×5): 15 mg via ORAL
  Administered 2018-04-04: 10 mg via ORAL
  Filled 2018-04-02 (×3): qty 3
  Filled 2018-04-02: qty 2
  Filled 2018-04-02 (×3): qty 3

## 2018-04-02 MED ORDER — MEPERIDINE HCL 25 MG/ML IJ SOLN: 6.2500 mg | INTRAMUSCULAR | Status: DC | PRN

## 2018-04-02 MED ORDER — FENTANYL CITRATE (PF) 100 MCG/2ML IJ SOLN
25.0000 ug | INTRAMUSCULAR | Status: DC | PRN
  Administered 2018-04-02: 50 ug via INTRAVENOUS

## 2018-04-02 MED ORDER — FENTANYL CITRATE (PF) 100 MCG/2ML IJ SOLN
INTRAMUSCULAR | Status: AC
  Filled 2018-04-02: qty 2

## 2018-04-02 MED ORDER — ACETAMINOPHEN 325 MG PO TABS: 325.0000 mg | ORAL_TABLET | Freq: Four times a day (QID) | ORAL | Status: DC | PRN

## 2018-04-02 MED ORDER — 0.9 % SODIUM CHLORIDE (POUR BTL) OPTIME
TOPICAL | Status: DC | PRN
  Administered 2018-04-02: 500 mL

## 2018-04-02 MED ORDER — SENNA 8.6 MG PO TABS
1.0000 | ORAL_TABLET | Freq: Two times a day (BID) | ORAL | Status: DC
  Administered 2018-04-02 – 2018-04-04 (×5): 8.6 mg via ORAL
  Filled 2018-04-02 (×5): qty 1

## 2018-04-02 MED ORDER — DICYCLOMINE HCL 20 MG PO TABS
20.0000 mg | ORAL_TABLET | Freq: Three times a day (TID) | ORAL | Status: DC
  Administered 2018-04-02 – 2018-04-04 (×7): 20 mg via ORAL
  Filled 2018-04-02 (×7): qty 1

## 2018-04-02 MED ORDER — HYDROMORPHONE HCL 2 MG/ML IJ SOLN
0.5000 mg | INTRAMUSCULAR | Status: DC | PRN
  Administered 2018-04-02 – 2018-04-04 (×7): 1 mg via INTRAVENOUS
  Filled 2018-04-02 (×7): qty 1

## 2018-04-02 MED ORDER — SODIUM CHLORIDE 0.9 % IV SOLN
INTRAVENOUS | Status: DC
  Administered 2018-04-02: 15:00:00 via INTRAVENOUS

## 2018-04-02 MED ORDER — DOCUSATE SODIUM 100 MG PO CAPS: 100.0000 mg | ORAL_CAPSULE | Freq: Two times a day (BID) | ORAL | Status: DC

## 2018-04-02 MED ORDER — SCOPOLAMINE 1 MG/3DAYS TD PT72
1.0000 | MEDICATED_PATCH | Freq: Once | TRANSDERMAL | Status: DC | PRN
  Filled 2018-04-02: qty 1

## 2018-04-02 MED ORDER — BUPIVACAINE-EPINEPHRINE (PF) 0.5% -1:200000 IJ SOLN
INTRAMUSCULAR | Status: AC
  Filled 2018-04-02: qty 30

## 2018-04-02 MED ORDER — PANTOPRAZOLE SODIUM 40 MG PO TBEC
40.0000 mg | DELAYED_RELEASE_TABLET | Freq: Every day | ORAL | Status: DC
  Administered 2018-04-02 – 2018-04-04 (×3): 40 mg via ORAL
  Filled 2018-04-02 (×3): qty 1

## 2018-04-02 MED ORDER — ENOXAPARIN SODIUM 40 MG/0.4ML ~~LOC~~ SOLN
40.0000 mg | SUBCUTANEOUS | Status: DC
  Administered 2018-04-03 – 2018-04-04 (×2): 40 mg via SUBCUTANEOUS
  Filled 2018-04-02 (×2): qty 0.4

## 2018-04-02 MED ORDER — ONDANSETRON HCL 4 MG PO TABS: 4.0000 mg | ORAL_TABLET | Freq: Four times a day (QID) | ORAL | Status: DC | PRN

## 2018-04-02 MED ORDER — COLESTIPOL HCL 1 G PO TABS
2.0000 g | ORAL_TABLET | Freq: Two times a day (BID) | ORAL | Status: DC
  Administered 2018-04-02 – 2018-04-04 (×4): 2 g via ORAL
  Filled 2018-04-02 (×5): qty 2

## 2018-04-02 MED ORDER — ASPIRIN EC 81 MG PO TBEC: 81.0000 mg | DELAYED_RELEASE_TABLET | Freq: Two times a day (BID) | ORAL | 0 refills | Status: AC

## 2018-04-02 MED ORDER — ONDANSETRON HCL 4 MG/2ML IJ SOLN: 4.0000 mg | Freq: Four times a day (QID) | INTRAMUSCULAR | Status: DC | PRN

## 2018-04-02 MED ORDER — DEXAMETHASONE SODIUM PHOSPHATE 10 MG/ML IJ SOLN
INTRAMUSCULAR | Status: DC | PRN
  Administered 2018-04-02: 10 mg via INTRAVENOUS

## 2018-04-02 MED ORDER — ONDANSETRON HCL 4 MG/2ML IJ SOLN
INTRAMUSCULAR | Status: DC | PRN
  Administered 2018-04-02: 4 mg via INTRAVENOUS

## 2018-04-02 MED ORDER — PHENYLEPHRINE HCL 10 MG/ML IJ SOLN
INTRAMUSCULAR | Status: DC | PRN
  Administered 2018-04-02: 40 ug via INTRAVENOUS

## 2018-04-02 MED ORDER — LIDOCAINE HCL (CARDIAC) PF 100 MG/5ML IV SOSY
PREFILLED_SYRINGE | INTRAVENOUS | Status: DC | PRN
  Administered 2018-04-02: 30 mg via INTRAVENOUS

## 2018-04-02 MED ORDER — MIDAZOLAM HCL 2 MG/2ML IJ SOLN
1.0000 mg | INTRAMUSCULAR | Status: DC | PRN
  Administered 2018-04-02: 2 mg via INTRAVENOUS

## 2018-04-02 SURGICAL SUPPLY — 63 items
BANDAGE ESMARK 6X9 LF (GAUZE/BANDAGES/DRESSINGS) IMPLANT
BLADE SURG 15 STRL LF DISP TIS (BLADE) ×2 IMPLANT
BLADE SURG 15 STRL SS (BLADE) ×4
BNDG COHESIVE 4X5 TAN STRL (GAUZE/BANDAGES/DRESSINGS) ×3 IMPLANT
BNDG COHESIVE 6X5 TAN STRL LF (GAUZE/BANDAGES/DRESSINGS) ×3 IMPLANT
BNDG ESMARK 6X9 LF (GAUZE/BANDAGES/DRESSINGS)
CHLORAPREP W/TINT 26ML (MISCELLANEOUS) ×3 IMPLANT
COVER BACK TABLE 60X90IN (DRAPES) ×3 IMPLANT
CUFF TOURNIQUET SINGLE 34IN LL (TOURNIQUET CUFF) ×3 IMPLANT
DECANTER SPIKE VIAL GLASS SM (MISCELLANEOUS) IMPLANT
DRAPE EXTREMITY T 121X128X90 (DRAPE) ×3 IMPLANT
DRAPE OEC MINIVIEW 54X84 (DRAPES) ×3 IMPLANT
DRAPE U-SHAPE 47X51 STRL (DRAPES) ×3 IMPLANT
DRSG MEPITEL 4X7.2 (GAUZE/BANDAGES/DRESSINGS) ×3 IMPLANT
DRSG PAD ABDOMINAL 8X10 ST (GAUZE/BANDAGES/DRESSINGS) ×6 IMPLANT
ELECT REM PT RETURN 9FT ADLT (ELECTROSURGICAL) ×3
ELECTRODE REM PT RTRN 9FT ADLT (ELECTROSURGICAL) ×1 IMPLANT
GAUZE SPONGE 4X4 12PLY STRL (GAUZE/BANDAGES/DRESSINGS) ×3 IMPLANT
GLOVE BIO SURGEON STRL SZ8 (GLOVE) ×3 IMPLANT
GLOVE BIOGEL PI IND STRL 7.0 (GLOVE) ×2 IMPLANT
GLOVE BIOGEL PI IND STRL 8 (GLOVE) ×2 IMPLANT
GLOVE BIOGEL PI INDICATOR 7.0 (GLOVE) ×4
GLOVE BIOGEL PI INDICATOR 8 (GLOVE) ×4
GLOVE ECLIPSE 6.5 STRL STRAW (GLOVE) ×3 IMPLANT
GLOVE ECLIPSE 8.0 STRL XLNG CF (GLOVE) ×3 IMPLANT
GOWN STRL REUS W/ TWL LRG LVL3 (GOWN DISPOSABLE) ×1 IMPLANT
GOWN STRL REUS W/ TWL XL LVL3 (GOWN DISPOSABLE) ×2 IMPLANT
GOWN STRL REUS W/TWL LRG LVL3 (GOWN DISPOSABLE) ×2
GOWN STRL REUS W/TWL XL LVL3 (GOWN DISPOSABLE) ×4
KWIRE SMOOTH 1.8 (WIRE) IMPLANT
NS IRRIG 1000ML POUR BTL (IV SOLUTION) ×3 IMPLANT
PACK BASIN DAY SURGERY FS (CUSTOM PROCEDURE TRAY) ×3 IMPLANT
PAD CAST 4YDX4 CTTN HI CHSV (CAST SUPPLIES) ×1 IMPLANT
PADDING CAST ABS 4INX4YD NS (CAST SUPPLIES) ×2
PADDING CAST ABS COTTON 4X4 ST (CAST SUPPLIES) ×1 IMPLANT
PADDING CAST COTTON 4X4 STRL (CAST SUPPLIES) ×2
PADDING CAST COTTON 6X4 STRL (CAST SUPPLIES) ×3 IMPLANT
PENCIL BUTTON HOLSTER BLD 10FT (ELECTRODE) ×3 IMPLANT
SANITIZER HAND PURELL 535ML FO (MISCELLANEOUS) ×3 IMPLANT
SCREW CANNULATED 5.0X42MM (Screw) ×3 IMPLANT
SCREW CANNULATED 5.0X44MM (Screw) ×3 IMPLANT
SHEET MEDIUM DRAPE 40X70 STRL (DRAPES) ×3 IMPLANT
SLEEVE SCD COMPRESS KNEE MED (MISCELLANEOUS) ×3 IMPLANT
SPLINT FAST PLASTER 5X30 (CAST SUPPLIES) ×40
SPLINT PLASTER CAST FAST 5X30 (CAST SUPPLIES) ×20 IMPLANT
SPONGE LAP 18X18 RF (DISPOSABLE) ×3 IMPLANT
STOCKINETTE 6  STRL (DRAPES) ×2
STOCKINETTE 6 STRL (DRAPES) ×1 IMPLANT
SUCTION FRAZIER HANDLE 10FR (MISCELLANEOUS) ×2
SUCTION TUBE FRAZIER 10FR DISP (MISCELLANEOUS) ×1 IMPLANT
SUT ETHILON 3 0 PS 1 (SUTURE) ×3 IMPLANT
SUT MNCRL AB 3-0 PS2 18 (SUTURE) ×3 IMPLANT
SUT VIC AB 0 SH 27 (SUTURE) IMPLANT
SUT VIC AB 2-0 SH 27 (SUTURE) ×2
SUT VIC AB 2-0 SH 27XBRD (SUTURE) ×1 IMPLANT
SYR BULB 3OZ (MISCELLANEOUS) ×3 IMPLANT
TOWEL GREEN STERILE FF (TOWEL DISPOSABLE) IMPLANT
TOWEL OR 17X24 6PK STRL BLUE (TOWEL DISPOSABLE) ×3 IMPLANT
TOWEL OR NON WOVEN STRL DISP B (DISPOSABLE) ×3 IMPLANT
TUBE CONNECTING 20'X1/4 (TUBING) ×1
TUBE CONNECTING 20X1/4 (TUBING) ×2 IMPLANT
UNDERPAD 30X30 (UNDERPADS AND DIAPERS) ×3 IMPLANT
WASHER FLAT 5.0MM (Washer) ×6 IMPLANT

## 2018-04-02 NOTE — Discharge Instructions (Addendum)
We recommend that you to take vitamin C 1000 mg a day to promote healing. We also recommend that if you require  pain medicine that you take a stool softener to prevent constipation as most pain medicines will have constipation side effects. We recommend either Peri-Colace or Senokot and recommend that you also consider adding MiraLAX as well to prevent the constipation affects from pain medicine if you are required to use them. These medicines are over the counter and may be purchased at a local pharmacy. A cup of yogurt and a probiotic can also be helpful during the recovery process as the medicines can disrupt your intestinal environment. Keep bandage clean and dry.  Call for any problems.  No smoking.  Continue elevation as it will decrease swelling.  If instructed by MD move your fingers within the confines of the bandage/splint.  Use ice if instructed by your MD. Call immediately for any sudden loss of feeling in your hand/arm or change in functional abilities of the extremity.Toni Arthurs, MD [facility]  Please read the following information regarding your care after surgery.  Medications  You only need a prescription for the narcotic pain medicine (ex. oxycodone, Percocet, Norco).  All of the other medicines listed below are available over the counter. X Aleve 2 pills twice a day for the first 3 days after surgery. X narcotic as prescribed for severe pain  Narcotic pain medicine (ex. oxycodone, Percocet, Vicodin) will cause constipation.  To prevent this problem, take the following medicines while you are taking any pain medicine. X docusate sodium (Colace) 100 mg twice a day X senna (Senokot) 2 tablets twice a day  X To help prevent blood clots, take a baby aspirin (81 mg) twice a day after surgery.  You should also get up every hour while you are awake to move around.    Weight Bearing X Bear weight when you are able on your operated leg or foot.  Cast / Splint / Dressing X  Keep your splint, cast or dressing clean and dry.  Dont put anything (coat hanger, pencil, etc) down inside of it.  If it gets damp, use a hair dryer on the cool setting to dry it.  If it gets soaked, call the office to schedule an appointment for a cast change.  After your dressing, cast or splint is removed; you may shower, but do not soak or scrub the wound.  Allow the water to run over it, and then gently pat it dry.  Swelling It is normal for you to have swelling where you had surgery.  To reduce swelling and pain, keep your toes above your nose for at least 3 days after surgery.  It may be necessary to keep your foot or leg elevated for several weeks.  If it hurts, it should be elevated.  Follow Up Call my office at [phone] when you are discharged from the hospital or surgery center to schedule an appointment to be seen two weeks after surgery.  Call my office at [phone] if you develop a fever >101.5 F, nausea, vomiting, bleeding from the surgical site or severe pain.

## 2018-04-02 NOTE — Anesthesia Preprocedure Evaluation (Signed)
Anesthesia Evaluation  Patient identified by MRN, date of birth, ID band Patient awake    Reviewed: Allergy & Precautions, NPO status , Patient's Chart, lab work & pertinent test results  Airway Mallampati: II  TM Distance: >3 FB Neck ROM: Full    Dental no notable dental hx.    Pulmonary neg pulmonary ROS, Current Smoker,    Pulmonary exam normal breath sounds clear to auscultation       Cardiovascular hypertension,  Rhythm:Regular Rate:Normal     Neuro/Psych negative neurological ROS  negative psych ROS   GI/Hepatic Neg liver ROS, GERD  ,  Endo/Other  negative endocrine ROS  Renal/GU negative Renal ROS     Musculoskeletal negative musculoskeletal ROS (+)   Abdominal   Peds  Hematology negative hematology ROS (+)   Anesthesia Other Findings   Reproductive/Obstetrics negative OB ROS                            Anesthesia Physical Anesthesia Plan  ASA: III  Anesthesia Plan: General   Post-op Pain Management: GA combined w/ Regional for post-op pain   Induction: Intravenous  PONV Risk Score and Plan: 1 and Ondansetron and Dexamethasone  Airway Management Planned: LMA and Oral ETT  Additional Equipment:   Intra-op Plan:   Post-operative Plan: Extubation in OR  Informed Consent: I have reviewed the patients History and Physical, chart, labs and discussed the procedure including the risks, benefits and alternatives for the proposed anesthesia with the patient or authorized representative who has indicated his/her understanding and acceptance.   Dental advisory given  Plan Discussed with: CRNA  Anesthesia Plan Comments:         Anesthesia Quick Evaluation

## 2018-04-02 NOTE — Anesthesia Postprocedure Evaluation (Signed)
Anesthesia Post Note  Patient: Eric Parsons  Procedure(s) Performed: OPEN REDUCTION, INTERNAL FIXATION (ORIF) CALCANEAL FRACTURE WITH FUSION (Right Foot)     Patient location during evaluation: PACU Anesthesia Type: General Level of consciousness: sedated and patient cooperative Pain management: pain level controlled Vital Signs Assessment: post-procedure vital signs reviewed and stable Respiratory status: spontaneous breathing Cardiovascular status: stable Anesthetic complications: no    Last Vitals:  Vitals:   04/02/18 1330 04/02/18 1414  BP:  (!) 196/104  Pulse: 87 100  Resp: 18 18  Temp:  37.1 C  SpO2: 100% 100%    Last Pain:  Vitals:   04/02/18 1414  TempSrc: Oral  PainSc:                  Lewie Loron

## 2018-04-02 NOTE — Anesthesia Procedure Notes (Signed)
Anesthesia Regional Block: Popliteal block   Pre-Anesthetic Checklist: ,, timeout performed, Correct Patient, Correct Site, Correct Laterality, Correct Procedure, Correct Position, site marked, Risks and benefits discussed,  Surgical consent,  Pre-op evaluation,  At surgeon's request and post-op pain management  Laterality: Right  Prep: chloraprep       Needles:  Injection technique: Single-shot  Needle Type: Stimiplex     Needle Length: 10cm  Needle Gauge: 21     Additional Needles:   Procedures:, nerve stimulator,,,,,,,  Motor weakness within 5 minutes.   Nerve Stimulator or Paresthesia:  Response: 0.5 mA,   Additional Responses:   Narrative:  Start time: 04/02/2018 9:29 AM End time: 04/02/2018 9:34 AM Injection made incrementally with aspirations every 5 mL.  Performed by: Personally  Anesthesiologist: Lewie Loron, MD  Additional Notes: Nerve located and needle positioned with direct ultrasound guidance. Good perineural spread. Patient tolerated well.

## 2018-04-02 NOTE — Interval H&P Note (Signed)
History and Physical Interval Note:  04/02/2018 10:11 AM  Eric Parsons  has presented today for surgery, with the diagnosis of RIGHT CALCANEOUS FRACTURE  The various methods of treatment have been discussed with the patient and family. After consideration of risks, benefits and other options for treatment, the patient has consented to  Procedure(s): OPEN REDUCTION, INTERNAL FIXATION (ORIF) CALCANEAL FRACTURE WITH FUSION (Right) as a surgical intervention .  The patient's history has been reviewed, patient examined, no change in status, stable for surgery.  I have reviewed the patient's chart and labs.  Questions were answered to the patient's satisfaction.    The risks and benefits of the alternative treatment options have been discussed in detail.  The patient wishes to proceed with surgery and specifically understands risks of bleeding, infection, nerve damage, blood clots, need for additional surgery, amputation and death.   Toni Arthurs

## 2018-04-02 NOTE — Transfer of Care (Signed)
Immediate Anesthesia Transfer of Care Note  Patient: Eric Parsons  Procedure(s) Performed: OPEN REDUCTION, INTERNAL FIXATION (ORIF) CALCANEAL FRACTURE WITH FUSION (Right Foot)  Patient Location: PACU  Anesthesia Type:GA combined with regional for post-op pain  Level of Consciousness: awake and patient cooperative  Airway & Oxygen Therapy: Patient Spontanous Breathing and Patient connected to face mask oxygen  Post-op Assessment: Report given to RN and Post -op Vital signs reviewed and stable  Post vital signs: Reviewed and stable  Last Vitals:  Vitals Value Taken Time  BP 123/69 04/02/2018 11:19 AM  Temp    Pulse 81 04/02/2018 11:20 AM  Resp 14 04/02/2018 11:20 AM  SpO2 100 % 04/02/2018 11:20 AM  Vitals shown include unvalidated device data.  Last Pain:  Vitals:   04/02/18 0905  TempSrc: Oral  PainSc: 8       Patients Stated Pain Goal: 3 (04/02/18 0905)  Complications: No apparent anesthesia complications

## 2018-04-02 NOTE — Anesthesia Procedure Notes (Signed)
Procedure Name: LMA Insertion Date/Time: 04/02/2018 10:14 AM Performed by: Sheryn Bison, CRNA Pre-anesthesia Checklist: Patient identified, Emergency Drugs available, Suction available and Patient being monitored Patient Re-evaluated:Patient Re-evaluated prior to induction Oxygen Delivery Method: Circle system utilized Preoxygenation: Pre-oxygenation with 100% oxygen Induction Type: IV induction Ventilation: Mask ventilation without difficulty LMA: LMA inserted LMA Size: 4.0 Number of attempts: 1 Airway Equipment and Method: Bite block Placement Confirmation: positive ETCO2 Tube secured with: Tape Dental Injury: Teeth and Oropharynx as per pre-operative assessment
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
## 2018-04-02 NOTE — Anesthesia Procedure Notes (Signed)
Anesthesia Regional Block: Adductor canal block   Pre-Anesthetic Checklist: ,, timeout performed, Correct Patient, Correct Site, Correct Laterality, Correct Procedure, Correct Position, site marked, Risks and benefits discussed,  Surgical consent,  Pre-op evaluation,  At surgeon's request and post-op pain management  Laterality: Right  Prep: chloraprep       Needles:  Injection technique: Single-shot  Needle Type: Stimiplex     Needle Length: 9cm  Needle Gauge: 21     Additional Needles:   Procedures:,,,, ultrasound used (permanent image in chart),,,,  Narrative:  Start time: 04/02/2018 9:25 AM End time: 04/02/2018 9:27 AM Injection made incrementally with aspirations every 5 mL.  Performed by: Personally  Anesthesiologist: Lewie Loron, MD  Additional Notes: BP cuff, EKG monitors applied. Sedation begun. Artery and nerve location verified with U/S and anesthetic injected incrementally, slowly, and after negative aspirations under direct u/s guidance. Good fascial /perineural spread. Tolerated well.

## 2018-04-02 NOTE — Progress Notes (Signed)
Assisted Dr. Germeroth with right, ultrasound guided, popliteal block. Side rails up, monitors on throughout procedure. See vital signs in flow sheet. Tolerated Procedure well. 
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
## 2018-04-03 NOTE — Progress Notes (Signed)
Patient's XXX status reassessed. Patient stated he no longer needs to be a XXX and can be taken off this privacy restriction.
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
## 2018-04-04 MED ORDER — HYDROMORPHONE HCL 2 MG PO TABS: 2.0000 mg | ORAL_TABLET | Freq: Three times a day (TID) | ORAL | 0 refills | Status: AC

## 2018-04-04 MED ORDER — METHOCARBAMOL 500 MG PO TABS: 500.0000 mg | ORAL_TABLET | Freq: Four times a day (QID) | ORAL | 0 refills | Status: AC

## 2018-04-04 NOTE — Progress Notes (Signed)
Patient and his wife was provided with written and verbal instructions for discharge.  They both verbalize understanding the discharge instructions.  Patient verbalizes understanding the importance of follow up appointment and to not remove the bandages until he sees his MD. The patient was provided with prescriptions.  The patient was discharged via wheel chair- no complaints voice on discharge.
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
## 2018-04-05 ENCOUNTER — Other Ambulatory Visit: Payer: Self-pay

## 2018-04-05 ENCOUNTER — Emergency Department (HOSPITAL_COMMUNITY)
Admission: EM | Admit: 2018-04-05 | Discharge: 2018-04-05 | Disposition: A | Discharge status: Home / Self Care | Payer: BLUE CROSS/BLUE SHIELD | Attending: Emergency Medicine | Admitting: Emergency Medicine

## 2018-04-05 ENCOUNTER — Encounter (HOSPITAL_COMMUNITY): Payer: Self-pay | Admitting: Emergency Medicine

## 2018-04-05 DIAGNOSIS — I10 Essential (primary) hypertension: Secondary | ICD-10-CM | POA: Diagnosis not present

## 2018-04-05 DIAGNOSIS — Z79899 Other long term (current) drug therapy: Secondary | ICD-10-CM | POA: Insufficient documentation

## 2018-04-05 DIAGNOSIS — Z7982 Long term (current) use of aspirin: Secondary | ICD-10-CM | POA: Insufficient documentation

## 2018-04-05 DIAGNOSIS — Z4789 Encounter for other orthopedic aftercare: Secondary | ICD-10-CM | POA: Diagnosis present

## 2018-04-05 DIAGNOSIS — F17210 Nicotine dependence, cigarettes, uncomplicated: Secondary | ICD-10-CM | POA: Insufficient documentation

## 2018-04-05 MED ORDER — OXYCODONE-ACETAMINOPHEN 5-325 MG PO TABS
2.0000 | ORAL_TABLET | Freq: Once | ORAL | Status: AC
  Administered 2018-04-05: 2 via ORAL
  Filled 2018-04-05: qty 2
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
## 2018-04-05 NOTE — ED Notes (Signed)
PA and ortho tech at bedside
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
## 2018-04-06 ENCOUNTER — Encounter (HOSPITAL_BASED_OUTPATIENT_CLINIC_OR_DEPARTMENT_OTHER): Payer: Self-pay | Admitting: Orthopedic Surgery
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
## 2018-04-07 ENCOUNTER — Emergency Department (HOSPITAL_COMMUNITY): Payer: BLUE CROSS/BLUE SHIELD

## 2018-04-07 ENCOUNTER — Other Ambulatory Visit: Payer: Self-pay

## 2018-04-07 ENCOUNTER — Emergency Department (HOSPITAL_COMMUNITY)
Admission: EM | Admit: 2018-04-07 | Discharge: 2018-04-07 | Disposition: A | Discharge status: Home / Self Care | Payer: BLUE CROSS/BLUE SHIELD | Attending: Emergency Medicine | Admitting: Emergency Medicine

## 2018-04-07 DIAGNOSIS — F17210 Nicotine dependence, cigarettes, uncomplicated: Secondary | ICD-10-CM | POA: Diagnosis not present

## 2018-04-07 DIAGNOSIS — K3184 Gastroparesis: Secondary | ICD-10-CM | POA: Diagnosis not present

## 2018-04-07 DIAGNOSIS — I10 Essential (primary) hypertension: Secondary | ICD-10-CM | POA: Insufficient documentation

## 2018-04-07 DIAGNOSIS — Z79899 Other long term (current) drug therapy: Secondary | ICD-10-CM | POA: Insufficient documentation

## 2018-04-07 DIAGNOSIS — R112 Nausea with vomiting, unspecified: Secondary | ICD-10-CM | POA: Diagnosis present

## 2018-04-07 LAB — COMPREHENSIVE METABOLIC PANEL
ALK PHOS: 67 U/L (ref 38–126)
ALT: 23 U/L (ref 17–63)
AST: 26 U/L (ref 15–41)
Albumin: 4.2 g/dL (ref 3.5–5.0)
Anion gap: 12 (ref 5–15)
BUN: 21 mg/dL — AB (ref 6–20)
CALCIUM: 9.8 mg/dL (ref 8.9–10.3)
CHLORIDE: 100 mmol/L — AB (ref 101–111)
CO2: 28 mmol/L (ref 22–32)
CREATININE: 0.99 mg/dL (ref 0.61–1.24)
Glucose, Bld: 142 mg/dL — ABNORMAL HIGH (ref 65–99)
Potassium: 3.8 mmol/L (ref 3.5–5.1)
Sodium: 140 mmol/L (ref 135–145)
Total Bilirubin: 1.2 mg/dL (ref 0.3–1.2)
Total Protein: 8.7 g/dL — ABNORMAL HIGH (ref 6.5–8.1)

## 2018-04-07 LAB — LIPASE, BLOOD: LIPASE: 22 U/L (ref 11–51)

## 2018-04-07 LAB — CBC
HCT: 38.3 % — ABNORMAL LOW (ref 39.0–52.0)
HEMOGLOBIN: 13.1 g/dL (ref 13.0–17.0)
MCH: 28.7 pg (ref 26.0–34.0)
MCHC: 34.2 g/dL (ref 30.0–36.0)
MCV: 84 fL (ref 78.0–100.0)
PLATELETS: 423 10*3/uL — AB (ref 150–400)
RBC: 4.56 MIL/uL (ref 4.22–5.81)
RDW: 13.2 % (ref 11.5–15.5)
WBC: 15.8 10*3/uL — AB (ref 4.0–10.5)

## 2018-04-07 MED ORDER — ONDANSETRON HCL 4 MG/2ML IJ SOLN
4.0000 mg | Freq: Once | INTRAMUSCULAR | Status: AC
  Administered 2018-04-07: 4 mg via INTRAVENOUS
  Filled 2018-04-07: qty 2

## 2018-04-07 MED ORDER — METOCLOPRAMIDE HCL 10 MG PO TABS: 10.0000 mg | ORAL_TABLET | Freq: Three times a day (TID) | ORAL | 0 refills | Status: AC | PRN

## 2018-04-07 MED ORDER — SODIUM CHLORIDE 0.9 % IV BOLUS
1000.0000 mL | Freq: Once | INTRAVENOUS | Status: AC
  Administered 2018-04-07: 1000 mL via INTRAVENOUS

## 2018-04-07 MED ORDER — METOCLOPRAMIDE HCL 5 MG/ML IJ SOLN
10.0000 mg | Freq: Once | INTRAMUSCULAR | Status: AC
  Administered 2018-04-07: 10 mg via INTRAVENOUS
  Filled 2018-04-07: qty 2

## 2018-04-07 MED ORDER — ONDANSETRON 4 MG PO TBDP: 4.0000 mg | ORAL_TABLET | Freq: Three times a day (TID) | ORAL | 0 refills | Status: DC | PRN

## 2018-04-07 MED ORDER — HYDROMORPHONE HCL 1 MG/ML IJ SOLN
1.0000 mg | Freq: Once | INTRAMUSCULAR | Status: AC
  Administered 2018-04-07: 1 mg via INTRAVENOUS
  Filled 2018-04-07: qty 1

## 2018-04-07 NOTE — Discharge Instructions (Addendum)
If you develop recurrent vomiting or inability to tolerate oral fluids or if you develop worsening or recurrent abdominal pain or any other new/concerning symptoms such as fever then return to the ER for evaluation.  Otherwise follow-up with a primary care physician and your gastroenterologist.
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
## 2018-04-07 NOTE — ED Notes (Signed)
Bed: [id number] Expected date:  Expected time:  Means of arrival:  Comments: EMS-Vomitting

## 2018-04-07 NOTE — ED Notes (Signed)
Patient transported to X-ray 
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
## 2018-04-07 NOTE — ED Provider Notes (Signed)
Patient is feeling better.  He has a leukocytosis on CBC, but I think this is likely from vomiting all day and degranulation.  He is feeling better after treatment and feels well enough to go home.  He drank a Coke at the bedside without difficulty.  I will discharge him with antiemetics and he already has GI follow-up next month.   Pricilla LovelessGoldston, Beverley Allender, MD 04/08/18 0005

## 2018-04-15 ENCOUNTER — Emergency Department (HOSPITAL_COMMUNITY): Payer: BLUE CROSS/BLUE SHIELD

## 2018-04-15 ENCOUNTER — Encounter (HOSPITAL_COMMUNITY): Payer: Self-pay | Admitting: Emergency Medicine

## 2018-04-15 ENCOUNTER — Emergency Department (HOSPITAL_COMMUNITY)
Admission: EM | Admit: 2018-04-15 | Discharge: 2018-04-15 | Disposition: A | Discharge status: Home / Self Care | Payer: BLUE CROSS/BLUE SHIELD | Attending: Emergency Medicine | Admitting: Emergency Medicine

## 2018-04-15 ENCOUNTER — Other Ambulatory Visit: Payer: Self-pay

## 2018-04-15 DIAGNOSIS — M25531 Pain in right wrist: Secondary | ICD-10-CM | POA: Diagnosis not present

## 2018-04-15 DIAGNOSIS — I10 Essential (primary) hypertension: Secondary | ICD-10-CM | POA: Insufficient documentation

## 2018-04-15 DIAGNOSIS — Z79899 Other long term (current) drug therapy: Secondary | ICD-10-CM | POA: Insufficient documentation

## 2018-04-15 DIAGNOSIS — Z7982 Long term (current) use of aspirin: Secondary | ICD-10-CM | POA: Insufficient documentation

## 2018-04-15 DIAGNOSIS — W19XXXA Unspecified fall, initial encounter: Secondary | ICD-10-CM

## 2018-04-15 DIAGNOSIS — F17210 Nicotine dependence, cigarettes, uncomplicated: Secondary | ICD-10-CM | POA: Diagnosis not present

## 2018-04-15 NOTE — ED Notes (Signed)
Voicemail left for social work

## 2018-04-15 NOTE — ED Notes (Signed)
Patient transported to xray via wheelchair.

## 2018-04-15 NOTE — ED Notes (Signed)
Social work at bedside.  
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
## 2018-04-15 NOTE — Discharge Instructions (Addendum)
Please continue taking your pain medications at home as prescribed.  Please follow-up with your orthopedic doctor on 04/17/18 as already scheduled and return to the ER sooner if you have any new or worsening symptoms in the meantime.

## 2018-04-15 NOTE — Progress Notes (Signed)
CSW spoke with pt at bedside. Pt asked that CSW give pt a place to stay. CSW offered pt shelter resource and pt expressed "I thought you all put people in places to stay?". CSW apologized to pt for this assumption and explained that CSW is only able to offered pt shelter list for housing at this time. Pt expressed that pt had a friend to call and would rather stay with that person instead of a shelter. No further CSW needs at this time. CSW will sign off.    Claude MangesKierra S. Tavaris Eudy, MSW, LCSW-A Emergency Department Clinical Social Worker [phone]

## 2018-04-15 NOTE — Discharge Planning (Signed)
Tyechia Allmendinger J. Lucretia RoersWood, RN, BSN, [employer] [phone] Spoke with pt at bedside regarding discharge planning for [facility]. Offered pt list of home health agencies to choose from.  Pt chose [facility] to render services. Eugenio HoesEllen Williams, [facility] notified. [facility] in 24-48 hours.  No DME needs identified at this time.   Pt is staying at [address] with friends.
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
## 2018-05-18 ENCOUNTER — Encounter: Payer: BLUE CROSS/BLUE SHIELD | Admitting: Internal Medicine

## 2018-05-18 ENCOUNTER — Telehealth: Payer: Self-pay | Admitting: Internal Medicine
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
## 2018-05-18 NOTE — Telephone Encounter (Signed)
Noted. Please list as no show

## 2019-06-02 NOTE — ED Notes (Signed)
ED Patient Education Note     Patient Education Materials Follows:  Allergy     Bee, Wasp, or Hornet Sting    Bees, wasps, and hornets are part of a family of insects that can sting people. These stings can cause pain and inflammation, but they are usually not serious. However, some people may have an allergic reaction to a sting. This can cause the symptoms to be more severe.       SYMPTOMS    Common symptoms of this condition include:      A red lump in the skin that sometimes has a tiny hole in the center. In some cases, a stinger may be in the center of the wound.     Pain and itching at the sting site.     Redness and swelling around the sting site. If you have an allergic reaction (localized allergic reaction), the swelling and redness may spread out from the sting site. In some cases, this reaction can continue to develop over the next 12?36 hours.    In rare cases, a person may have a severe allergic reaction (anaphylactic reaction) to a sting. Symptoms of an anaphylactic reaction may include:      Wheezing or difficulty breathing.     Raised, itchy, red patches on the skin.     Nausea or vomiting.     Abdominal cramping.     Diarrhea.     Chest pain.     Fainting.     Redness of the face (flushing).    DIAGNOSIS    This condition is usually diagnosed based on symptoms, medical history, and a physical exam.    TREATMENT    Most stings can be treated with:      Icing to reduce swelling.     Medicines (antihistamines) to treat itching or an allergic reaction.     Medicines to help reduce pain. These may be medicines that you take by mouth, or medicated creams or lotions that you apply to your skin.     If you were stung by a bee, the stinger and a small sac of poison may be in the wound. This may be removed by brushing across it with a flat card, such as a credit card. Another method is to pinch the area and pull it out. These methods can help reduce the severity of the body's reaction to the sting.     HOME  CARE INSTRUCTIONS     Wash the sting site daily with soap and water as told by your health care provider.     Apply or take over-the-counter and prescription medicines only as told by your health care provider.     If directed, apply ice to the sting area.    ? ?Put ice in a plastic bag.    ? ?Place a towel between your skin and the bag.    ? ?Leave the ice on for 20 minutes, 2?3 times per day.     Do not scratch the sting area.     To lessen pain, try using a paste that is made of water and baking soda. Rub the paste on the sting area and leave it on for 5 minutes.     If you had a severe allergic reaction to a sting, you may need:    ? ?To wear a medical bracelet or necklace that lists the allergy.    ? ?To learn when and how to use an anaphylaxis kit   or epinephrine injection. Your family members may also need to learn this.    ? ?To carry an anaphylaxis kit with you at all times.    SEEK MEDICAL CARE IF:     Your symptoms do not get better in 2?3 days.     You have redness, swelling, or pain that spreads beyond the area of the sting.     You have a fever.    SEEK IMMEDIATE MEDICAL CARE IF:    You have symptoms of a severe allergic reaction. These include:      Wheezing or difficulty breathing.     Chest pain.     Light-headedness or fainting.     Itchy, raised, red patches on the skin.     Nausea or vomiting.     Abdominal cramping.     Diarrhea.    This information is not intended to replace advice given to you by your health care provider. Make sure you discuss any questions you have with your health care provider.    Document Released: 10/21/2005 Document Revised: 07/12/2015 Document Reviewed: 03/08/2015  Elsevier Interactive Patient Education ?2016 [place].      Musculoskeletal     Shoulder Pain    Many things can cause shoulder pain, including:     An injury to the area.     Overuse of the shoulder.     Arthritis.    The source of the pain can be:     Inflammation.     An injury to the shoulder joint.      An injury to a tendon, ligament, or bone.    HOME CARE INSTRUCTIONS    Take these actions to help with your pain:       Squeeze a soft ball or a foam pad as much as possible. This helps to keep the shoulder from swelling. It also helps to strengthen the arm.     Take over-the-counter and prescription medicines only as told by your health care provider.     If directed, apply ice to the area:    ? Put ice in a plastic bag.    ? Place a towel between your skin and the bag.    ? Leave the ice on for 20 minutes, 2?3 times per day. Stop applying ice if it does not help with the pain.     If you were given a shoulder sling or immobilizer:    ? Wear it as told.    ? Remove it to shower or bathe.    ? Move your arm as little as possible, but keep your hand moving to prevent swelling.    SEEK MEDICAL CARE IF:     Your pain gets worse.     Your pain is not relieved with medicines.     New pain develops in your arm, hand, or fingers.    SEEK IMMEDIATE MEDICAL CARE IF:     Your arm, hand, or fingers:    ? Tingle.    ? Become numb.    ? Become swollen.    ? Become painful.    ? Turn white or blue.    This information is not intended to replace advice given to you by your health care provider. Make sure you discuss any questions you have with your health care provider.    Document Released: 07/31/2005 Document Revised: 10/02/2015 Document Reviewed: 02/13/2015  Elsevier Interactive Patient Education ?2016 [place].

## 2019-06-02 NOTE — ED Provider Notes (Signed)
[facility] - Insect Bite or Sting        Patient:   Jeffrey Berry, Jeffrey Berry             MRN: [id number]            FIN: [id number]               Age:   48 years     Sex:  Male     DOB:  [birth year]   Associated Diagnoses:   Wasp sting; Right shoulder pain   Author:   Willaim Sheng      Basic Information   History source: Patient.   Arrival mode: Private vehicle.   History limitation: None.   Additional information: Chief Complaint from Nursing Triage Note   Chief Complaint  Chief Complaint: Patient comes to ER for a wasp sting to his head.  He says he was stung about 11am today. (06/02/19 19:53:00).      History of Present Illness   The patient presents with an insect sting.  The onset was 8  hours ago.  The course/duration of symptoms is constant.  The location where the incident occurred was unknown.  Location: Scalp face. The character of symptoms is burning and swelling.  The degree of symptoms is moderate.  Patient with wasp sting his frontal scalp that occurred at 11AM.  Since then, the swelling has progressed to his forehead.  He has additional c/o right shoulder pain x 3 weeks.  Denies injury or radiating pain.  Worse with movement.  .        Review of Systems   Constitutional symptoms:  Negative except as documented in HPI.   Skin symptoms:  wasp bite.   Respiratory symptoms:  Negative except as documented in HPI.   Gastrointestinal symptoms:  Negative except as documented in HPI.   Musculoskeletal symptoms:  Joint pain.   Neurologic symptoms:  Negative except as documented in HPI.   Psychiatric symptoms:  Negative except as documented in HPI.             Additional review of systems information: All other systems reviewed and otherwise negative.      Health Status   Allergies:    Allergic Reactions (Selected)  No Known Medication Allergies.      Past Medical/ Family/ Social History   Medical history: Reviewed as documented in chart.   Surgical history: Reviewed as documented in chart.   Family history: Not significant.    Social history: Reviewed as documented in chart.   Problem list:    Active Problems (1)  No Chronic Problems   .      Physical Examination               Vital Signs   Vital Signs   06/02/2019 20:23 EDT Respiratory Rate 18 br/min   11/28/5807 98:33 EDT Systolic Blood Pressure 825 mmHg    Diastolic Blood Pressure 99 mmHg  HI    Temperature Oral 37.3 degC    Heart Rate Monitored 87 bpm    Respiratory Rate 16 br/min    SpO2 98 %   .   Measurements   06/02/2019 19:58 EDT Body Mass Index est meas 22.50 kg/m2    Body Mass Index Measured 22.50 kg/m2   06/02/2019 19:53 EDT Height/Length Measured 177 cm    Weight Dosing 70.5 kg   .   Basic Oxygen Information   06/02/2019 19:53 EDT SpO2 98 %    Oxygen Therapy Room air   .  General:  Alert, no acute distress.    Skin:  insect bite to the frontal scalp with moderate swelling.   Neck:  No tenderness.   Respiratory:  Respirations are non-labored.   Musculoskeletal:  Proximal upper extremity: Right, shoulder, range of motion restricted by pain.   Neurological:  Alert and oriented to person, place, time, and situation, No focal neurological deficit observed, normal speech observed.    Psychiatric:  Cooperative, appropriate mood & affect.       Medical Decision Making   Differential Diagnosis:  Insect bite, insect sting.    Documents reviewed:  Emergency department nurses' notes.      Reexamination/ Reevaluation   Vital signs   Basic Oxygen Information   06/02/2019 19:53 EDT SpO2 98 %    Oxygen Therapy Room air         Impression and Plan   Diagnosis   Wasp sting (ICD10-CM T63.461A, Discharge, Medical)   Right shoulder pain (ICD10-CM M25.511, Discharge, Medical)   Plan   Condition: Stable.    Disposition: Medically cleared, Discharged: to home.    Prescriptions: [facility] Meds List (Selected)   Prescriptions  Prescribed  Flexeril 10 mg oral tablet: 10 mg, 1 tabs, Oral, TID, for 5 days, PRN: muscle pain, 15 tabs, 0 Refill(s)  predniSONE 20 mg oral tablet: 40 mg, 2 tabs, Oral, Daily, for 5  days, 10 tabs, 0 Refill(s).    Patient was given the following educational materials: Bee, Wasp, or AK [employer] CorporationHornet Sting, Shoulder Pain.    Follow up with: Lollie SailsHARRY RUDOLPH-MD, Physician - Orthopedics Within 3 to 5 days Return to ED if symptoms worsen.    Counseled: Patient, Regarding diagnosis, Regarding treatment plan, Regarding prescription, Patient indicated understanding of instructions.    Signature Line     Electronically Signed on 06/03/2019 06:27 PM EDT   ________________________________________________   Rozelle LoganMALONE-PA,  Kiernan Farkas      Electronically Signed on 06/03/2019 10:36 PM EDT   ________________________________________________   DITTRICH-MD, Jesse SansHOMAS C            Modified by: Rozelle LoganMALONE-PA,  Ruie Sendejo on 06/03/2019 02:20 AM EDT      Modified by: Rozelle LoganMALONE-PA,  Flora Ratz on 06/03/2019 02:55 AM EDT      Modified by: Rozelle LoganMALONE-PA,  Nemesio Castrillon on 06/03/2019 06:27 PM EDT      Reviewed by: Rozelle LoganMALONE-PA,  Justice Aguirre

## 2019-06-02 NOTE — Discharge Summary (Signed)
 ED Clinical Summary                     [facility]  [address]  [place], [place] [address]  [phone]          PERSON INFORMATION  Name: JANES, COLEGROVE Age:  48 Years DOB: 03/25/1971   Sex: Male Language: English PCP: PCP,  NONE   Marital Status: Single Phone: [phone] Med Service: MED-Medicine   MRN: [id number] Acct# [id number] Arrival: 06/02/2019 19:36:00   Visit Reason: UC - Insect Bite or Sting; Body aches; wasp sting; WASP STING Acuity: 4 LOS: 000 01:37   Address:    [address] [place] [address]   Diagnosis:    Right shoulder pain; Wasp sting  Medications:          New Medications  Printed Prescriptions  cyclobenzaprine (Flexeril 10 mg oral tablet) 1 Tabs Oral (given by mouth) 3 times a day as needed muscle pain for 5 Days. Refills: 0.,  THIS MEDICATION IS ASSOCIATED  WITH  AN INCREASED RISK OF FALLS.  Last Dose:____________________  predniSONE (predniSONE 20 mg oral tablet) 2 Tabs Oral (given by mouth) every day for 5 Days. Refills: 0.  Last Dose:____________________      Medications Administered During Visit:              Allergies      No Known Medication Allergies      Major Tests and Procedures:  The following procedures and tests were performed during your ED visit.  COMMON PROCEDURES%>  COMMON PROCEDURES COMMENTS%>                PROVIDER INFORMATION               Provider Role Assigned Sampson LAURETHA NIEMANN ED MidLevel 06/02/2019 20:00:22    Ethyl OBIE FALLOW ED Nurse 06/02/2019 20:03:48        Attending Physician:  LAURETHA NIEMANN      Admit Doc  MALONE-PA,  THERESA     Consulting Doc       VITALS INFORMATION  Vital Sign Triage Latest   Temp Oral ORAL_1%> ORAL%>   Temp Temporal TEMPORAL_1%> TEMPORAL%>   Temp Intravascular INTRAVASCULAR_1%> INTRAVASCULAR%>   Temp Axillary AXILLARY_1%> AXILLARY%>   Temp Rectal RECTAL_1%> RECTAL%>   02 Sat 98 % 98 %   Respiratory Rate RATE_1%> RATE%>   Peripheral Pulse Rate PULSE RATE_1%> PULSE RATE%>   Apical  Heart Rate HEART RATE_1%> HEART RATE%>   Blood Pressure BLOOD PRESSURE_1%>/ BLOOD PRESSURE_1%>99 mmHg BLOOD PRESSURE%> / BLOOD PRESSURE%>99 mmHg                 Immunizations      No Immunizations Documented This Visit          DISCHARGE INFORMATION   Discharge Disposition: H Outpt-Sent Home   Discharge Location:  Home   Discharge Date and Time:  06/02/2019 21:13:55   ED Checkout Date and Time:  06/02/2019 21:13:55     DEPART REASON INCOMPLETE INFORMATION               Depart Action Incomplete Reason   Interactive View/I&O Recently assessed               Problems      Active           No Chronic Problems              Smoking Status  10 or more cigarettes (1/2 pack or more)/day in last 30 days         PATIENT EDUCATION INFORMATION  Instructions:     Bee, Wasp, or Personnel officer; Shoulder Pain     Follow up:                   With: Address: When:   HARRY RUDOLPH-MD, Physician - Orthopedics [address], [address] [place] [address]  [phone] Business (1) Within 3 to 5 days   Comments:   Return to ED if symptoms worsen              ED PROVIDER DOCUMENTATION

## 2019-06-02 NOTE — ED Notes (Signed)
 ED Patient Summary       ;       [facility] Emergency Department  [address] [place] [address]  [phone]  Discharge Instructions (Patient)  _______________________________________     Name: Jeffrey Berry, Jeffrey Berry  DOB: 02-12-1971                   MRN: [id number]                   FIN: [id number]%[id number]  Reason For Visit: UC - Insect Bite or Sting; Body aches; wasp sting; WASP STING  Final Diagnosis: Right shoulder pain; Wasp sting     Visit Date: 06/02/2019 19:36:00  Address: [address] [place] [address]  Phone: [phone]     Primary Care Provider:      Name: PCP,  NONE      Phone:         Emergency Department Providers:         Primary Physician:            [facility] would like to thank you for allowing us  to assist you with your healthcare needs. The following includes patient education materials and information regarding your injury/illness.     Follow-up Instructions: You were treated today on an emergency basis, it may be wise to contact your primary care provider to notify them of your visit today. You may have been referred to your regular doctor or a specialist, please follow up as instructed. If your condition worsens or you can't get in to see the doctor, contact the Emergency Department.              With: Address: When:   HARRY RUDOLPH-MD, Physician - Orthopedics [address] [place], [place] [address]  [phone] Business (1) Within 3 to 5 days   Comments:   Return to ED if symptoms worsen              Printed Prescriptions:    Patient Education Materials:  Discharge Orders          Discharge Patient 06/02/19 21:00:00 EDT         Comment:      Bee, Wasp, or Hornet Sting; Shoulder Pain     Bee, Wasp, or Hornet Sting    Bees, wasps, and hornets are part of a family of insects that can sting people. These stings can cause pain and inflammation, but they are usually not serious. However, some people may have an allergic reaction to a sting. This can cause  the symptoms to be more severe.       SYMPTOMS    Common symptoms of this condition include:      A red lump in the skin that sometimes has a tiny hole in the center. In some cases, a stinger may be in the center of the wound.     Pain and itching at the sting site.     Redness and swelling around the sting site. If you have an allergic reaction (localized allergic reaction), the swelling and redness may spread out from the sting site. In some cases, this reaction can continue to develop over the next 12?36 hours.    In rare cases, a person may have a severe allergic reaction (anaphylactic reaction) to a sting. Symptoms of an anaphylactic reaction may include:      Wheezing or difficulty breathing.     Raised, itchy, red patches on  the skin.     Nausea or vomiting.     Abdominal cramping.     Diarrhea.     Chest pain.     Fainting.     Redness of the face (flushing).    DIAGNOSIS    This condition is usually diagnosed based on symptoms, medical history, and a physical exam.    TREATMENT    Most stings can be treated with:      Icing to reduce swelling.     Medicines (antihistamines) to treat itching or an allergic reaction.     Medicines to help reduce pain. These may be medicines that you take by mouth, or medicated creams or lotions that you apply to your skin.     If you were stung by a bee, the stinger and a small sac of poison may be in the wound. This may be removed by brushing across it with a flat card, such as a credit card. Another method is to pinch the area and pull it out. These methods can help reduce the severity of the body's reaction to the sting.     HOME CARE INSTRUCTIONS     Wash the sting site daily with soap and water as told by your health care provider.     Apply or take over-the-counter and prescription medicines only as told by your health care provider.     If directed, apply ice to the sting area.    ? ?Put ice in a plastic bag.    ? ?Place a towel between your skin and the bag.     ? ?Leave the ice on for 20 minutes, 2?3 times per day.     Do not scratch the sting area.     To lessen pain, try using a paste that is made of water and baking soda. Rub the paste on the sting area and leave it on for 5 minutes.     If you had a severe allergic reaction to a sting, you may need:    ? ?To wear a medical bracelet or necklace that lists the allergy.    ? ?To learn when and how to use an anaphylaxis kit or epinephrine injection. Your family members may also need to learn this.    ? ?To carry an anaphylaxis kit with you at all times.    SEEK MEDICAL CARE IF:     Your symptoms do not get better in 2?3 days.     You have redness, swelling, or pain that spreads beyond the area of the sting.     You have a fever.    SEEK IMMEDIATE MEDICAL CARE IF:    You have symptoms of a severe allergic reaction. These include:      Wheezing or difficulty breathing.     Chest pain.     Light-headedness or fainting.     Itchy, raised, red patches on the skin.     Nausea or vomiting.     Abdominal cramping.     Diarrhea.    This information is not intended to replace advice given to you by your health care provider. Make sure you discuss any questions you have with your health care provider.    Document Released: 10/21/2005 Document Revised: 07/12/2015 Document Reviewed: 03/08/2015  Elsevier Interactive Patient Education ?2016 Elsevier Inc.       Shoulder Pain    Many things can cause shoulder pain, including:     An injury to the area.  Overuse of the shoulder.     Arthritis.    The source of the pain can be:     Inflammation.     An injury to the shoulder joint.     An injury to a tendon, ligament, or bone.    HOME CARE INSTRUCTIONS    Take these actions to help with your pain:       Squeeze a soft ball or a foam pad as much as possible. This helps to keep the shoulder from swelling. It also helps to strengthen the arm.     Take over-the-counter and prescription medicines only as told by your health care provider.      If directed, apply ice to the area:    ? Put ice in a plastic bag.    ? Place a towel between your skin and the bag.    ? Leave the ice on for 20 minutes, 2?3 times per day. Stop applying ice if it does not help with the pain.     If you were given a shoulder sling or immobilizer:    ? Wear it as told.    ? Remove it to shower or bathe.    ? Move your arm as little as possible, but keep your hand moving to prevent swelling.    SEEK MEDICAL CARE IF:     Your pain gets worse.     Your pain is not relieved with medicines.     New pain develops in your arm, hand, or fingers.    SEEK IMMEDIATE MEDICAL CARE IF:     Your arm, hand, or fingers:    ? Tingle.    ? Become numb.    ? Become swollen.    ? Become painful.    ? Turn white or blue.    This information is not intended to replace advice given to you by your health care provider. Make sure you discuss any questions you have with your health care provider.    Document Released: 07/31/2005 Document Revised: 10/02/2015 Document Reviewed: 02/13/2015  Elsevier Interactive Patient Education ?2016 Elsevier Inc.         Allergy Info: No Known Medication Allergies     Medication Information:  [facility] provided you with a complete list of medications post discharge, if you have been instructed to stop taking a medication please ensure you also follow up with this information to your Primary Care Physician.  Unless otherwise noted, patient will continue to take medications as prescribed prior to the Emergency Room visit.  Any specific questions regarding your chronic medications and dosages should be discussed with your physician(s) and pharmacist.          cyclobenzaprine (Flexeril 10 mg oral tablet) 1 Tabs Oral (given by mouth) 3 times a day as needed muscle pain for 5 Days. Refills: 0.,  THIS MEDICATION IS ASSOCIATED  WITH  AN INCREASED RISK OF FALLS.  predniSONE (predniSONE 20 mg oral tablet) 2 Tabs Oral (given by mouth) every day for 5 Days.  Refills: 0.      Medications Administered During Visit:       Major Tests and Procedures:  The following procedures and tests were performed during your ED visit.  COMMON PROCEDURES%>  COMMON PROCEDURES COMMENTS%>          Laboratory Orders  No laboratory orders were placed.              Radiology Orders  No radiology  orders were placed.              Patient Care Orders  Name Status Details   Discharge Patient Ordered 06/02/19 21:00:00 EDT   ED Assessment Adult Completed 06/02/19 19:58:04 EDT, 06/02/19 19:58:04 EDT   ED Secondary Triage Completed 06/02/19 19:58:04 EDT, 06/02/19 19:58:04 EDT   ED Triage Adult Completed 06/02/19 19:36:33 EDT, 06/02/19 19:36:33 EDT   Ice Pack Application Completed 06/02/19 20:55:00 EDT, Once, 06/02/19 20:55:00 EDT, To affected area       ---------------------------------------------------------------------------------------------------------------------  Florie Shelvy [facility]) encourages you to self-enroll in the [facility] Patient Portal.  [facility] Patient Portal will allow you to manage your personal health information securely from your own electronic device now and in the future.  To begin your Patient Portal enrollment process, please visit [email or web address] Click on "Sign up now" under [facility].  If you find that you need additional assistance on the [facility] Patient Portal or need a copy of your medical records, please call the [facility] Medical Records Office at [phone].  Comment:

## 2019-06-02 NOTE — ED Notes (Signed)
ED Triage Note       ED Secondary Triage Entered On:  06/02/2019 20:00 EDT    Performed On:  06/02/2019 19:58 EDT by Ninfa LindenHARLE, RN, JOSEPH I               General Information   Barriers to Learning :   None evident   ED Home Meds Section :   Document assessment   [facility] ED Fall Risk Section :   Document assessment   ED History Section :   Document assessment   ED Advance Directives Section :   Document assessment   ED Palliative Screen :   N/A (prefilled for <65yo)   Ninfa LindenHARLE, RN, JOSEPH I - 06/02/2019 19:58 EDT   (As Of: 06/02/2019 20:00:02 EDT)   Problems(Active)    No Chronic Problems (Cerner  :NKP )  Name of Problem:   No Chronic Problems ; Recorder:   Ninfa LindenHARLE, RN, JOSEPH I; Code:   NKP ; Last Updated:   06/02/2019 19:57 EDT ; Life Cycle Date:   06/02/2019 ; Life Cycle Status:   Active ; Vocabulary:   Cerner          Diagnoses(Active)    wasp sting  Date:   06/02/2019 ; Diagnosis Type:   Reason For Visit ; Confirmation:   Complaint of ; Clinical Dx:   wasp sting ; Classification:   Medical ; Clinical Service:   Emergency medicine ; Code:   PNED ; Probability:   0 ; Diagnosis Code:   [id number]             -    Procedure History   (As Of: 06/02/2019 20:00:02 EDT)     Anesthesia Minutes:   0 ; Procedure Name:   Foot ; Procedure Minutes:   0 ; Last Reviewed Dt/Tm:   06/02/2019 19:58:55 EDT            Anesthesia Minutes:   0 ; Procedure Name:   Wrist ; Procedure Minutes:   0 ; Last Reviewed Dt/Tm:   06/02/2019 19:58:40 EDT            [facility] Fall [facility]   Hx of falling last 3 months ED Fall :   No   Patient confused or disoriented ED Fall :   No   Patient intoxicated or sedated ED Fall :   No   Patient impaired gait ED Fall :   No   Use a mobility assistance device ED Fall :   No   Patient altered elimination ED Fall :   No   [facility] ED Fall Score :   0    Ninfa LindenHARLE, RN, [place] - 06/02/2019 19:58 EDT   ED Advance Directive   Advance Directive :   No   Ninfa LindenHARLE, RN, JOSEPH I - 06/02/2019 19:58 EDT    Social History   Social History   (As Of: 06/02/2019 20:00:02 EDT)   Tobacco:        Tobacco use: 10 or more cigarettes (1/2 pack or more)/day in last 30 days.  Cigarettes   (Last Updated: 06/02/2019 19:59:46 EDT by Ninfa LindenHARLE, RN, JOSEPH I)          Alcohol:        Current, 1-2 times per week   (Last Updated: 06/02/2019 19:59:46 EDT by Ninfa LindenHARLE, RN, JOSEPH I)          Substance Use:        Current, 1-2 times per month, Marijuana   (  Last Updated: 06/02/2019 19:59:46 EDT by Ninfa Linden, RN, JOSEPH I)            Med Hx   Medication List   (As Of: 06/02/2019 20:00:02 EDT)   No Known Home Medications     Ninfa Linden RN, JOSEPH I - 06/02/2019 19:59:08

## 2019-06-02 NOTE — ED Notes (Signed)
ED Triage Note       ED Triage Adult Entered On:  06/02/2019 19:58 EDT    Performed On:  06/02/2019 19:53 EDT by Francesca Jewett, RN, Jeffrey Berry               Triage   Chief Complaint :   Patient comes to ER for a wasp sting to his head.  He says he was stung about 11am today.   Numeric Rating Pain Scale :   7   [place] Mode of Arrival :   Walking   Infectious Disease Documentation :   Document assessment   Patient received chemo or biotherapy last 48 hrs? :   No   Temperature Oral :   37.3 degC(Converted to: 99.1 degF)    Heart Rate Monitored :   87 bpm   Respiratory Rate :   16 br/min   Systolic Blood Pressure :   161 mmHg   Diastolic Blood Pressure :   99 mmHg (HI)    SpO2 :   98 %   Oxygen Therapy :   Room air   Patient presentation :   None of the above   Chief Complaint or Presentation suggest infection :   No   Dosing Weight Obtained By :   Patient stated   Weight Dosing :   70.5 kg(Converted to: 155 lb 7 oz)    Height :   177 cm(Converted to: 5 ft 10 in)    Body Mass Index Dosing :   23 kg/m2   HARLE, RN, Jeffrey Berry - 06/02/2019 19:53 EDT   DCP GENERIC CODE   Tracking Group :   ED [employer] Tracking [employer]   Tracking Acuity :   4   [place], RN, [place] Berry - 06/02/2019 19:53 EDT   ED General Section :   Document assessment   Pregnancy Status :   N/A   ED Allergies Section :   Document assessment   ED Reason for Visit Section :   Document assessment   ED Quick Assessment :   Patient appears awake, alert, oriented to baseline. Skin warm and dry. Moves all extremities. Respiration even and unlabored. Appears in no apparent distress.   HARLE, RN, [place] Berry - 06/02/2019 19:53 EDT   ID Risk Screen Symptoms   Recent Travel History :   No recent travel   Close Contact with COVID-19 ID :   No   Last 14 days COVID-19 ID :   No   TB Symptom Screen :   No symptoms   C. diff Symptom/History ID :   Neither of the above   HARLE, RN, Jeffrey Berry - 06/02/2019 19:53 EDT   Allergies   (As Of: 06/02/2019 19:58:04 EDT)   Allergies (Active)   No Known Medication  Allergies  Estimated Onset Date:   Unspecified ; Created ByFrancesca Jewett, RN, Jeffrey Berry; Reaction Status:   Active ; Category:   Drug ; Substance:   No Known Medication Allergies ; Type:   Allergy ; Updated By:   Fortino Sic; Reviewed Date:   06/02/2019 19:56 EDT        Psycho-Social   Last 3 mo, thoughts killing self/others :   Patient denies   ED Behavioral Activity Rating Scale :   4 - Quiet and awake (normal level of activity)   Francesca Jewett, RN, [place] Berry - 06/02/2019 19:53 EDT   ED Reason for Visit   (As Of: 06/02/2019 20:06:29 EDT)  Problems(Active)    No Chronic Problems (Cerner  :NKP )  Name of Problem:   No Chronic Problems ; Recorder:   Ninfa LindenHARLE, RN, Jeffrey Berry; Code:   NKP ; Last Updated:   06/02/2019 19:57 EDT ; Life Cycle Date:   06/02/2019 ; Life Cycle Status:   Active ; Vocabulary:   Cerner          Diagnoses(Active)    Body aches  Date:   06/02/2019 ; Diagnosis Type:   Reason For Visit ; Confirmation:   Complaint of ; Clinical Dx:   Body aches ; Classification:   Medical ; Clinical Service:   Emergency medicine ; Code:   PNED ; Probability:   0 ; Diagnosis Code:   [id number]      wasp sting  Date:   06/02/2019 ; Diagnosis Type:   Reason For Visit ; Confirmation:   Complaint of ; Clinical Dx:   wasp sting ; Classification:   Medical ; Clinical Service:   Emergency medicine ; Code:   PNED ; Probability:   0 ; Diagnosis Code:   [id number]

## 2019-06-06 ENCOUNTER — Other Ambulatory Visit: Payer: Self-pay

## 2019-06-06 ENCOUNTER — Encounter (HOSPITAL_COMMUNITY): Payer: Self-pay | Admitting: *Deleted

## 2019-06-06 ENCOUNTER — Emergency Department (HOSPITAL_COMMUNITY)
Admission: EM | Admit: 2019-06-06 | Discharge: 2019-06-06 | Disposition: A | Discharge status: Home / Self Care | Payer: BLUE CROSS/BLUE SHIELD | Attending: Emergency Medicine | Admitting: Emergency Medicine

## 2019-06-06 DIAGNOSIS — K529 Noninfective gastroenteritis and colitis, unspecified: Secondary | ICD-10-CM | POA: Insufficient documentation

## 2019-06-06 DIAGNOSIS — F17210 Nicotine dependence, cigarettes, uncomplicated: Secondary | ICD-10-CM | POA: Insufficient documentation

## 2019-06-06 DIAGNOSIS — F17220 Nicotine dependence, chewing tobacco, uncomplicated: Secondary | ICD-10-CM | POA: Insufficient documentation

## 2019-06-06 DIAGNOSIS — I10 Essential (primary) hypertension: Secondary | ICD-10-CM | POA: Insufficient documentation

## 2019-06-06 DIAGNOSIS — Z79899 Other long term (current) drug therapy: Secondary | ICD-10-CM | POA: Insufficient documentation

## 2019-06-06 DIAGNOSIS — Z7982 Long term (current) use of aspirin: Secondary | ICD-10-CM | POA: Insufficient documentation

## 2019-06-06 LAB — COMPREHENSIVE METABOLIC PANEL
ALT: 20 U/L (ref 0–44)
AST: 18 U/L (ref 15–41)
Albumin: 3.8 g/dL (ref 3.5–5.0)
Alkaline Phosphatase: 54 U/L (ref 38–126)
Anion gap: 14 (ref 5–15)
BUN: 12 mg/dL (ref 6–20)
CO2: 22 mmol/L (ref 22–32)
Calcium: 8.9 mg/dL (ref 8.9–10.3)
Chloride: 99 mmol/L (ref 98–111)
Creatinine, Ser: 1.27 mg/dL — ABNORMAL HIGH (ref 0.61–1.24)
GFR calc Af Amer: >60 mL/min (ref >60)
GFR calc non Af Amer: >60 mL/min (ref >60)
Glucose, Bld: 109 mg/dL — ABNORMAL HIGH (ref 70–99)
Potassium: 3.4 mmol/L — ABNORMAL LOW (ref 3.5–5.1)
Sodium: 135 mmol/L (ref 135–145)
Total Bilirubin: 0.9 mg/dL (ref 0.3–1.2)
Total Protein: 7.4 g/dL (ref 6.5–8.1)

## 2019-06-06 LAB — CBC
HCT: 42 % (ref 39.0–52.0)
Hemoglobin: 14 g/dL (ref 13.0–17.0)
MCH: 28.9 pg (ref 26.0–34.0)
MCHC: 33.3 g/dL (ref 30.0–36.0)
MCV: 86.6 fL (ref 80.0–100.0)
Platelets: 298 10*3/uL (ref 150–400)
RBC: 4.85 MIL/uL (ref 4.22–5.81)
RDW: 13.5 % (ref 11.5–15.5)
WBC: 11.9 10*3/uL — ABNORMAL HIGH (ref 4.0–10.5)
nRBC: 0 % (ref 0.0–0.2)

## 2019-06-06 LAB — URINALYSIS, ROUTINE W REFLEX MICROSCOPIC
Bacteria, UA: NONE SEEN
Bilirubin Urine: NEGATIVE
Glucose, UA: NEGATIVE mg/dL
Ketones, ur: 20 mg/dL — AB
Leukocytes,Ua: NEGATIVE
Nitrite: NEGATIVE
Protein, ur: 30 mg/dL — AB
RBC / HPF: >50 RBC/hpf — ABNORMAL HIGH (ref 0–5)
Specific Gravity, Urine: 1.029 (ref 1.005–1.030)
pH: 6 (ref 5.0–8.0)

## 2019-06-06 LAB — LIPASE, BLOOD: Lipase: 24 U/L (ref 11–51)

## 2019-06-06 MED ORDER — POTASSIUM CHLORIDE CRYS ER 20 MEQ PO TBCR
40.0000 meq | EXTENDED_RELEASE_TABLET | Freq: Once | ORAL | Status: AC
  Administered 2019-06-06: 40 meq via ORAL
  Filled 2019-06-06: qty 2

## 2019-06-06 MED ORDER — LACTATED RINGERS IV BOLUS
1000.0000 mL | Freq: Once | INTRAVENOUS | Status: AC
  Administered 2019-06-06: 21:00:00 1000 mL via INTRAVENOUS

## 2019-06-06 MED ORDER — MORPHINE SULFATE (PF) 4 MG/ML IV SOLN
6.0000 mg | Freq: Once | INTRAVENOUS | Status: AC
  Administered 2019-06-06: 6 mg via INTRAVENOUS
  Filled 2019-06-06: qty 2

## 2019-06-06 MED ORDER — ONDANSETRON 4 MG PO TBDP: 4.0000 mg | ORAL_TABLET | Freq: Three times a day (TID) | ORAL | 0 refills | Status: AC | PRN

## 2019-06-06 MED ORDER — ONDANSETRON HCL 4 MG/2ML IJ SOLN
4.0000 mg | Freq: Once | INTRAMUSCULAR | Status: AC
  Administered 2019-06-06: 4 mg via INTRAVENOUS
  Filled 2019-06-06: qty 2

## 2019-06-06 MED ORDER — SODIUM CHLORIDE 0.9% FLUSH: 3.0000 mL | Freq: Once | INTRAVENOUS | Status: DC

## 2019-06-06 MED ORDER — FAMOTIDINE 20 MG PO TABS: 20.0000 mg | ORAL_TABLET | Freq: Two times a day (BID) | ORAL | 0 refills | Status: AC

## 2019-06-06 NOTE — Discharge Instructions (Signed)
If you develop worsening, continued, or recurrent abdominal pain, uncontrolled vomiting, fever, chest or back pain, or any other new/concerning symptoms then return to the ER for evaluation.  

## 2019-06-06 NOTE — ED Triage Notes (Signed)
Pt went out with friends last night, had a few drinks. On the way home pt reported epigastric pain radiating into back with NV. Reports blood in emesis on several occassions.
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
## 2019-06-24 LAB — COVID-19: SARS-CoV-2: NOT DETECTED

## 2019-06-24 LAB — TROPONIN T: Troponin T: <0.01 ng/mL (ref 0.000–0.010)

## 2019-06-24 NOTE — ED Provider Notes (Signed)
Back pain        Patient:   Jeffrey Berry, ULBRICHT             MRN: [id number]            FIN: [id number]               Age:   48 years     Sex:  Male     DOB:  12-18-1970   Associated Diagnoses:   Viral syndrome   Author:   Maudie Flakes      Basic Information   Additional information: Chief Complaint from Nursing Triage Note   Chief Complaint  Chief Complaint: back pain, chest pain and constipation (06/24/19 16:02:00).      History of Present Illness   The patient presents with 48 year old male with history of GERD and current marijuana user presents with new onset of bilateral crampy low back pain that started last night.  He also reports subjective chills, pain with taking a deep breath, nausea, vomiting, intermittent burning abdominal pain.  He has had one episode of vomiting since onset.  His symptoms do not radiate.  Nothing seems to aggravate or alleviate the symptoms.  He is tried taking nausea medicine with some relief.  He reports he is at [facility] earlier today and had a work-up including blood work and a CT scan of his abdomen and pelvis.  He states he was discharged from the ER with nausea medicine.  He then went to [facility] to be seen for his same symptoms and was discharged from there with nausea medicine and steroids.  States he did not receive a COVID-19 test today.  Last COVID-19 test was 2 weeks ago and was negative.  Does report recently being seen in emergency department in [facility] for abdominal pain and then in the ER in [place] for abdominal pain and was treated with antibiotics.  He states he is in the process of completing 2 different antibiotics; he is unsure of the name of the antibiotics.  He denies syncope, near syncope, headache, nasal ingestion, runny nose, ear pain, sore throat, difficulty swallowing, palpitations, chest pain, cough, hemoptysis, hematemesis, hematochezia, melena, diarrhea, hematuria, dysuria, urinary urgency or frequency,  penile pain or discharge, testicular pain or swelling, numbness or tingling of the lower extremities, saddle anesthesia, bowel or bladder incontinence, skin change or rash, trauma or injury, IV drug use, medication change, previous surgeries to the abdomen, history of IBS or Crohn's or ulcerative colitis or diverticulitis or pancreatitis.    I spoke with Lyndee Leo provider at [facility] who was able to access his medical records from [place] and [facility]  She states that the patient was at [facility] at 8 AM this morning complaining of flank pain.  At that time he received blood work including a CBC and CMP which were normal.  He also had a negative urinalysis.  He did have positive marijuana on a drug screen.  He had a CT scan of the abdomen pelvis that was unremarkable.  While at [place] he received a shot of Toradol and some nausea medicine as he did have an episode of vomiting there.  He then eloped from [place].  He went to [facility] where he was examined by Lyndee Leo and felt he had a musculoskeletal pain that was reproducible on exam.  She treated him with 5 mg of Valium, a Lidoderm patch and 10 mg of IM Decadron.  She  also prescribed the patient Flexeril and prednisone and recommend primary care follow-up..        Review of Systems             Additional review of systems information: All other systems reviewed and otherwise negative.      Health Status   Allergies:    Allergic Reactions (Selected)  No Known Medication Allergies.   Medications:  (Selected)   Inpatient Medications  Ordered  Flexeril: 10 mg, 1 tabs, Oral, Once  Tylenol: 1,000 mg, 2 tabs, Oral, Once.      Past Medical/ Family/ Social History   Medical history: Reviewed as documented in chart.   Surgical history: Reviewed as documented in chart.   Family history: Not significant.   Social history: Reviewed as documented in chart.   Problem list:    Active Problems (1)  No Chronic Problems   .       Physical Examination               Vital Signs   Vital Signs   06/24/2019 16:02 EDT Systolic Blood Pressure 141 mmHg  HI    Diastolic Blood Pressure 98 mmHg  HI    Temperature Oral 36.5 degC    Heart Rate Monitored 68 bpm    Respiratory Rate 18 br/min    SpO2 100 %   .   Measurements   06/24/2019 16:03 EDT Body Mass Index est meas 20.99 kg/m2    Body Mass Index Measured 20.99 kg/m2   06/24/2019 16:02 EDT Height/Length Measured 180 cm    Weight Dosing 68 kg   .   Basic Oxygen Information   06/24/2019 16:02 EDT SpO2 100 %    Oxygen Therapy Room air   .   General:  Alert, no acute distress, Well-appearing.    Skin:  Warm, dry, intact, no rash.    Head:  Normocephalic, atraumatic.    Ears, nose, mouth and throat:  Oral mucosa moist.   Cardiovascular:  Regular rate and rhythm, No murmur, Normal peripheral perfusion, No edema.    Respiratory:  Lungs are clear to auscultation, respirations are non-labored, breath sounds are equal.    Gastrointestinal:  Soft, Nontender, Non distended, Normal bowel sounds.    Back:  No midline tenderness, no step-offs.  Full range of motion demonstrated.  Mild bilateral tenderness palpation of the paraspinal muscles in the lower lumbar region.  Negative straight leg raise bilaterally.  No CVA tenderness..   Musculoskeletal:  Normal ROM, normal strength.    Neurological:  Alert and oriented to person, place, time, and situation, normal speech observed.    Psychiatric:  Cooperative, appropriate mood & affect.       Medical Decision Making   Documents reviewed:  Emergency department nurses' notes.   Results review:  Lab results : Lab View   06/24/2019 16:43 EDT      Trop T Quant              <0.010 ng/mL  .   Radiology results:  Rad Results (ST)   XR Chest 1 View Portable  ?  06/24/19 16:44:13  Chest AP: 06/24/19    INDICATION:Shortness of breath (SOB).    COMPARISON: None    FINDINGS: No pneumothorax or pleural fluid. No edema or focal opacity.  Cardiomediastinal contours are normal for AP  technique. No evident bony  abnormality.    IMPRESSION:  No acute cardiopulmonary abnormality.  ?  Signed By: Rosetta PosnerAMPBELL-MD, JOHNNY P  .  Notes:  EKG performed at 1645 reviewed by myself shows normal sinus rhythm at 75 bpm, nonspecific ST changes, no ectopy, normal intervals.  Patient is afebrile, hemodynamically stable and in good condition.  Is well-appearing.  Nontoxic-appearing.  No signs of distress.  Lungs are clear to auscultation bilaterally.  Oxygen saturations high 90s throughout ER visit.  Abdominal exam benign.  No findings to suggest cauda equina syndrome, spinal hematoma or epidural abscess.  History and physical exam are most consistent with viral syndrome.  COVID-19 test performed here.  Patient treated symptomatically here.  Recommend follow-up with his primary care provider via telehealth visit in 2 to 4 days.  Patient should closely observe her symptoms and return to the ER immediately for any new or worsening symptoms..      Reexamination/ Reevaluation   Vital signs   Basic Oxygen Information   06/24/2019 16:02 EDT SpO2 100 %    Oxygen Therapy Room air         Impression and Plan   Diagnosis   Viral syndrome (ICD10-CM B34.9, Discharge, Medical)   Plan   Condition: Stable.    Disposition: Discharged: to home.    Patient was given the following educational materials: Viral Syndrome, Easy-To-Read (CUSTOM), CDC self quarantine [phone]([id number]).    [facility] Within 2 to 4 days Via telehealth visit.  Return to ED if symptoms worsen.    Counseled: I had a detailed discussion with the patient and/or guardian regarding the historical points/exam findings supporting the discharge diagnosis and need for outpatient followup. Discussed the need to return to the ER if symptoms persist/worsen, or for any questions/concerns that arise at home.    Signature Line     Electronically Signed on 06/24/2019 05:31 PM EDT   ________________________________________________   Gearldine BienenstockBURNETT-PA,  Hamsini Verrilli N       Electronically Signed on 06/26/2019 07:19 AM EDT   ________________________________________________   Rolena InfanteLINE-MD,  CHRISTOPHER C            Modified by: Gearldine BienenstockBURNETT-PA,  Stan Cantave N on 06/24/2019 05:31 PM EDT

## 2019-06-24 NOTE — ED Notes (Signed)
ED Pre-Arrival Note        Pre-Arrival Summary    Name:  Jeffrey Berry,    Current Date:  06/24/2019 16:01:40 EDT  Gender:  Male  Date of Birth:    Age:  48  Pre-Arrival Type:  EMS  ETA:  06/24/2019 16:20:00 EDT  Primary Care Physician:    Presenting Problem:  Back pain  Pre-Arrival User:  Julien Girt, RN, Jewel Baize A  Referring Source:    Location:  [place]  BP:  145/98  HR:  98  O2:  100            PreArrival Communication Form  Emergency Department        Additional Patient Information:        Orders:  [    ] CBC                                            [     ] CT Head no contrast  [    ] BMP                                           [     ] CT Abdomen/Pelvis no contrast  [    ] PT/INR                                       [     ] CT Abdomen/Pelvis IV contrast, w/ oral contrast  [    ] Troponin                                   [     ] CT Abdomen/Pelvis IV contrast, no oral contrast  [    ] BNP                                            [     ] See ordersheet  [    ] CXR                                             [     ] Other:__________________________  [    ] EKG
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
## 2019-06-24 NOTE — ED Notes (Signed)
ED Patient Education Note     Patient Education Materials Follows:              A patient with symptomatic laboratory-confirmed COVID-19  OR  A patient under investigation  Household members, intimate partners, and caregivers in a nonhealthcare setting may have close contact2 with a person with symptomatic, laboratory-confirmed COVID-19 or a person under investigation. Close contacts should monitor their health; they should call their healthcare provider right away if they develop symptoms suggestive of COVID-19 (e.g., fever, cough, shortness of breath) (see Interim [place] Guidance for Risk Assessment and Public Health Management of Persons with Potential Coronavirus Disease 2019 (COVID-19) Exposure in Travel-associated or [employer].)  Close contacts should also follow these recommendations:    Make sure that you understand and can help the patient follow their healthcare provider's instructions for medication(s) and care. You should help the patient with basic needs in the home and provide support for getting groceries, prescriptions, and other personal needs.     Monitor the patient's symptoms. If the patient is getting sicker, call his or her healthcare provider and tell them that the patient has laboratory-confirmed COVID-19. This will help the healthcare provider's office take steps to keep other people in the office or waiting room from getting infected. Ask the healthcare provider to call the local or state health department for additional guidance. If the patient has a medical emergency and you need to call 911, notify the dispatch personnel that the patient has, or is being evaluated for COVID-19.     Household members should stay in another room or be separated from the patient as much as possible. Household members should use a separate bedroom and bathroom, if available.     Prohibit visitors who do not have an essential need to be in the home.     Household members should care for any pets in the  home. Do not handle pets or other animals while sick. For more information, see COVID-19 and Animals.     Make sure that shared spaces in the home have good air flow, such as by an air conditioner or an opened window, weather permitting.     Perform hand hygiene frequently. Wash your hands often with soap and water for at least 20 seconds or use an alcohol-based hand sanitizer that contains 60 to 95% alcohol, covering all surfaces of your hands and rubbing them together until they feel dry. Soap and water should be used preferentially if hands are visibly dirty.     Avoid touching your eyes, nose, and mouth with unwashed hands.     The patient should wear a facemask when you are around other people. If the patient is not able to wear a facemask (for example, because it causes trouble breathing), you, as the caregiver, should wear a mask when you are in the same room as the patient.     Wear a disposable facemask and gloves when you touch or have contact with the patient's blood, stool, or body fluids, such as saliva, sputum, nasal mucus, vomit, urine.     Throw out disposable facemasks and gloves after using them. Do not reuse.     When removing personal protective equipment, first remove and dispose of gloves. Then, immediately clean your hands with soap and water or alcohol-based hand sanitizer. Next, remove and dispose of facemask, and immediately clean your hands again with soap and water or alcohol-based hand sanitizer.    Avoid sharing household items with the  patient. You should not share dishes, drinking glasses, cups, eating utensils, towels, bedding, or other items. After the patient uses these items, you should wash them thoroughly (see below "[employer] thoroughly").     Clean all "high-touch" surfaces, such as counters, tabletops, doorknobs, bathroom fixtures, toilets, phones, keyboards, tablets, and bedside tables, every day. Also, clean any surfaces that may have blood, stool, or body fluids on  them.     Use a household cleaning spray or wipe, according to the label instructions. Labels contain instructions for safe and effective use of the cleaning product including precautions you should take when applying the product, such as wearing gloves and making sure you have good ventilation during use of the product.    Wash laundry thoroughly.     Immediately remove and wash clothes or bedding that have blood, stool, or body fluids on them.     Wear disposable gloves while handling soiled items and keep soiled items away from your body. Clean your hands (with soap and water or an alcohol-based hand sanitizer) immediately after removing your gloves.     Read and follow directions on labels of laundry or clothing items and detergent. In general, using a normal laundry detergent according to washing machine instructions and dry thoroughly using the warmest temperatures recommended on the clothing label.    Place all used disposable gloves, facemasks, and other contaminated items in a lined container before disposing of them with other household waste. Clean your hands (with soap and water or an alcohol-based hand sanitizer) immediately after handling these items. Soap and water should be used preferentially if hands are visibly dirty.     Discuss any additional questions with your state or local health department or healthcare provider. Check available hours when contacting your local health department.              Viral Infection  A viral infection is an illness that affects various parts of the body  It is caused by a germ called a virus.  Some examples of this kind of infection are:    . A cold.  . The flu (influenza).  . A respiratory syncytial virus (RSV) infection.        Gastroenteritis        Pharyngitis  HOW DO I KNOW IF I HAVE THIS INFECTION?  Most of the time this infection causes:  . A stuffy or runny nose.  . Yellow or green fluid in the nose.  . A cough.  . Sneezing.  . Tiredness (fatigue).  . Achy  muscles.  . A sore throat.  . Sweating or chills.  . A fever.  . A headache.        Nausea, vomiting, or diarrhea  HOW IS THIS INFECTION TREATED?  If the flu is diagnosed early, it may be treated with an antiviral medicine. This medicine shortens the length of time a person has symptoms. Symptoms may be treated with over-the-counter and prescription medicines, such as:  Marland Kitchen Expectorants. These make it easier to cough up mucus.  . Decongestant nasal sprays.  Doctors do not prescribe antibiotic medicines for viral infections. They do not work with this kind of infection.  HOW DO I KNOW IF I SHOULD STAY HOME?  To keep others from getting sick, stay home if you have:  . A fever.  . A lasting cough.  . A sore throat.  . A runny nose.  . Sneezing.   . Muscles aches.  . Headaches.  Marland Kitchen  Tiredness.  . Weakness.  . Chills.  . Sweating.  . An upset stomach (nausea).  HOME CARE  . Rest as much as possible.  . Take over-the-counter and prescription medicines only as told by your doctor.  . Drink enough fluid to keep your pee (urine) clear or pale yellow.  . If the throat is sore gargle with salt water. Do this 3-4 times per day or as needed. To make a salt-water mixture, dissolve -1 tsp of salt in 1 cup of warm water. Make sure the salt dissolves all the way.  . Use nose drops made from salt water. This helps with stuffiness (congestion). It also helps soften the skin around your nose.  . Do not drink alcohol.  . Do not use tobacco products, including cigarettes, chewing tobacco, and e-cigarettes. If you need help quitting, ask your doctor.  GET HELP IF:  . Your symptoms last for 10 days or longer.  . Your symptoms get worse over time.  . You have a fever.  . You have very bad pain in your face or forehead.  . Parts of your jaw or neck become very swollen.  GET HELP RIGHT AWAY IF:  . You feel pain or pressure in your chest.  . You have shortness of breath.  . You faint or feel like you will faint.  . You keep throwing up  (vomiting).  . You feel confused.  This information is not intended to replace advice given to you by your health care provider. Make sure you discuss any questions you have with your health care provider.  Document Released: 10/03/2008 Document Revised: 10/02/2015 Document Reviewed: 03/29/2015  Elsevier Interactive Patient Education [employer]

## 2019-06-24 NOTE — ED Notes (Signed)
ED Patient Education Note     Patient Education Materials Follows:              A patient with symptomatic laboratory-confirmed COVID-19  OR  A patient under investigation  Household members, intimate partners, and caregivers in a nonhealthcare setting may have close contact2 with a person with symptomatic, laboratory-confirmed COVID-19 or a person under investigation. Close contacts should monitor their health; they should call their healthcare provider right away if they develop symptoms suggestive of COVID-19 (e.g., fever, cough, shortness of breath) (see Interim [place] Guidance for Risk Assessment and Public Health Management of Persons with Potential Coronavirus Disease 2019 (COVID-19) Exposure in Travel-associated or Community Settings.)  Close contacts should also follow these recommendations:    Make sure that you understand and can help the patient follow their healthcare provider???s instructions for medication(s) and care. You should help the patient with basic needs in the home and provide support for getting groceries, prescriptions, and other personal needs.     Monitor the patient???s symptoms. If the patient is getting sicker, call his or her healthcare provider and tell them that the patient has laboratory-confirmed COVID-19. This will help the healthcare provider???s office take steps to keep other people in the office or waiting room from getting infected. Ask the healthcare provider to call the local or state health department for additional guidance. If the patient has a medical emergency and you need to call 911, notify the dispatch personnel that the patient has, or is being evaluated for COVID-19.     Household members should stay in another room or be separated from the patient as much as possible. Household members should use a separate bedroom and bathroom, if available.     Prohibit visitors who do not have an essential need to be in the home.     Household members should care for any pets in the  home. Do not handle pets or other animals while sick. For more information, see COVID-19 and Animals.     Make sure that shared spaces in the home have good air flow, such as by an air conditioner or an opened window, weather permitting.     Perform hand hygiene frequently. Wash your hands often with soap and water for at least 20 seconds or use an alcohol-based hand sanitizer that contains 60 to 95% alcohol, covering all surfaces of your hands and rubbing them together until they feel dry. Soap and water should be used preferentially if hands are visibly dirty.     Avoid touching your eyes, nose, and mouth with unwashed hands.     The patient should wear a facemask when you are around other people. If the patient is not able to wear a facemask (for example, because it causes trouble breathing), you, as the caregiver, should wear a mask when you are in the same room as the patient.     Wear a disposable facemask and gloves when you touch or have contact with the patient???s blood, stool, or body fluids, such as saliva, sputum, nasal mucus, vomit, urine.     Throw out disposable facemasks and gloves after using them. Do not reuse.     When removing personal protective equipment, first remove and dispose of gloves. Then, immediately clean your hands with soap and water or alcohol-based hand sanitizer. Next, remove and dispose of facemask, and immediately clean your hands again with soap and water or alcohol-based hand sanitizer.    Avoid sharing household items with the   patient. You should not share dishes, drinking glasses, cups, eating utensils, towels, bedding, or other items. After the patient uses these items, you should wash them thoroughly (see below ???Wash laundry thoroughly???).     Clean all ???high-touch??? surfaces, such as counters, tabletops, doorknobs, bathroom fixtures, toilets, phones, keyboards, tablets, and bedside tables, every day. Also, clean any surfaces that may have blood, stool, or body fluids on  them.     Use a household cleaning spray or wipe, according to the label instructions. Labels contain instructions for safe and effective use of the cleaning product including precautions you should take when applying the product, such as wearing gloves and making sure you have good ventilation during use of the product.    Wash laundry thoroughly.     Immediately remove and wash clothes or bedding that have blood, stool, or body fluids on them.     Wear disposable gloves while handling soiled items and keep soiled items away from your body. Clean your hands (with soap and water or an alcohol-based hand sanitizer) immediately after removing your gloves.     Read and follow directions on labels of laundry or clothing items and detergent. In general, using a normal laundry detergent according to washing machine instructions and dry thoroughly using the warmest temperatures recommended on the clothing label.    Place all used disposable gloves, facemasks, and other contaminated items in a lined container before disposing of them with other household waste. Clean your hands (with soap and water or an alcohol-based hand sanitizer) immediately after handling these items. Soap and water should be used preferentially if hands are visibly dirty.     Discuss any additional questions with your state or local health department or healthcare provider. Check available hours when contacting your local health department.              Viral Infection  A viral infection is an illness that affects various parts of the body  It is caused by a germ called a virus.  Some examples of this kind of infection are:    ??? A cold.  ??? The flu (influenza).  ??? A respiratory syncytial virus (RSV) infection.        Gastroenteritis        Pharyngitis  HOW DO I KNOW IF I HAVE THIS INFECTION?  Most of the time this infection causes:  ??? A stuffy or runny nose.  ??? Yellow or green fluid in the nose.  ??? A cough.  ??? Sneezing.  ??? Tiredness (fatigue).  ??? Achy  muscles.  ??? A sore throat.  ??? Sweating or chills.  ??? A fever.  ??? A headache.        Nausea, vomiting, or diarrhea  HOW IS THIS INFECTION TREATED?  If the flu is diagnosed early, it may be treated with an antiviral medicine. This medicine shortens the length of time a person has symptoms. Symptoms may be treated with over-the-counter and prescription medicines, such as:  ??? Expectorants. These make it easier to cough up mucus.  ??? Decongestant nasal sprays.  Doctors do not prescribe antibiotic medicines for viral infections. They do not work with this kind of infection.  HOW DO I KNOW IF I SHOULD STAY HOME?  To keep others from getting sick, stay home if you have:  ??? A fever.  ??? A lasting cough.  ??? A sore throat.  ??? A runny nose.  ??? Sneezing.   ??? Muscles aches.  ??? Headaches.  ???   Tiredness.  ??? Weakness.  ??? Chills.  ??? Sweating.  ??? An upset stomach (nausea).  HOME CARE  ??? Rest as much as possible.  ??? Take over-the-counter and prescription medicines only as told by your doctor.  ??? Drink enough fluid to keep your pee (urine) clear or pale yellow.  ??? If the throat is sore gargle with salt water. Do this 3???4 times per day or as needed. To make a salt???water mixture, dissolve ?????1 tsp of salt in 1 cup of warm water. Make sure the salt dissolves all the way.  ??? Use nose drops made from salt water. This helps with stuffiness (congestion). It also helps soften the skin around your nose.  ??? Do not drink alcohol.  ??? Do not use tobacco products, including cigarettes, chewing tobacco, and e-cigarettes. If you need help quitting, ask your doctor.  GET HELP IF:  ??? Your symptoms last for 10 days or longer.  ??? Your symptoms get worse over time.  ??? You have a fever.  ??? You have very bad pain in your face or forehead.  ??? Parts of your jaw or neck become very swollen.  GET HELP RIGHT AWAY IF:  ??? You feel pain or pressure in your chest.  ??? You have shortness of breath.  ??? You faint or feel like you will faint.  ??? You keep throwing up  (vomiting).  ??? You feel confused.  This information is not intended to replace advice given to you by your health care provider. Make sure you discuss any questions you have with your health care provider.  Document Released: 10/03/2008 Document Revised: 10/02/2015 Document Reviewed: 03/29/2015  Elsevier Interactive Patient Education ??2016 [employer] Inc[employer]

## 2019-06-24 NOTE — ED Notes (Signed)
ED Triage Note       ED Secondary Triage Entered On:  06/24/2019 16:53 EDT    Performed On:  06/24/2019 16:52 EDT by Catalina LungerPerrino, RN, Gillermina PhyMarni J               General Information   Barriers to Learning :   None evident   ED Home Meds Section :   Document assessment   [facility] Fall [facility] Section :   Document assessment   ED Advance Directives Section :   Document assessment   ED Palliative Screen :   N/A (prefilled for <65yo)   Catalina LungerPerrino, RN, Gillermina PhyMarni J - 06/24/2019 16:52 EDT   (As Of: 06/24/2019 16:53:07 EDT)   Problems(Active)    No Chronic Problems (Cerner  :NKP )  Name of Problem:   No Chronic Problems ; Recorder:   Ninfa LindenHARLE, RN, JOSEPH I; Code:   NKP ; Last Updated:   06/02/2019 19:57 EDT ; Life Cycle Date:   06/02/2019 ; Life Cycle Status:   Active ; Vocabulary:   Cerner          Diagnoses(Active)    Back pain  Date:   06/24/2019 ; Diagnosis Type:   Reason For Visit ; Confirmation:   Confirmed ; Clinical Dx:   Back pain ; Classification:   Medical ; Clinical Service:   Emergency medicine ; Code:   PNED ; Probability:   0 ; Diagnosis Code:   [id number]      Medical screening exam  Date:   06/24/2019 ; Diagnosis Type:   Reason For Visit ; Confirmation:   Complaint of ; Clinical Dx:   Medical screening exam ; Classification:   Medical ; Clinical Service:   Non-Specified ; Code:   PNED ; Probability:   0 ; Diagnosis Code:   [id number]             -    Procedure History   (As Of: 06/24/2019 16:53:07 EDT)     Anesthesia Minutes:   0 ; Procedure Name:   Foot ; Procedure Minutes:   0            Anesthesia Minutes:   0 ; Procedure Name:   Wrist ; Procedure Minutes:   0            [facility] Fall Risk Assessment Tool   Hx of falling last 3 months ED Fall :   No   Patient confused or disoriented ED Fall :   No   Patient intoxicated or sedated ED Fall :   No   Patient impaired gait ED Fall :   No   Use a mobility assistance device ED Fall :   No   Patient altered elimination ED Fall :   No    [facility] ED Fall Score :   0    Perrino, RN, Gillermina PhyMarni J - 06/24/2019 16:52 EDT   ED Advance Directive   Advance Directive :   No   Perrino, RN, Gillermina PhyMarni J - 06/24/2019 16:52 EDT   Med Hx   Medication List   (As Of: 06/24/2019 16:53:07 EDT)   Normal Order    acetaminophen 500 mg Tab  :   acetaminophen 500 mg Tab ; Status:   Completed ; Ordered As Mnemonic:   Tylenol ; Simple Display Line:   1,000 mg, 2 tabs, Oral, Once ; Ordering Provider:   Gearldine BienenstockBURNETT-PA,  JAMIE N; Catalog Code:   acetaminophen ; Order Dt/Tm:   06/24/2019 16:12:34 EDT  cyclobenzaprine 10 mg Tab  :   cyclobenzaprine 10 mg Tab ; Status:   Completed ; Ordered As Mnemonic:   Flexeril ; Simple Display Line:   10 mg, 1 tabs, Oral, Once ; Ordering Provider:   Gearldine Bienenstock; Catalog Code:   cyclobenzaprine ; Order Dt/Tm:   06/24/2019 16:12:34 EDT          hyoscyamine 0.125 mg SL Tab  :   hyoscyamine 0.125 mg SL Tab ; Status:   Completed ; Ordered As Mnemonic:   Levsin ; Simple Display Line:   0.125 mg, 1 tabs, SL, Once ; Ordering Provider:   Gearldine Bienenstock; Catalog Code:   hyoscyamine ; Order Dt/Tm:   06/24/2019 16:20:42 EDT

## 2019-06-24 NOTE — Discharge Summary (Signed)
ED Clinical Summary                        [facility]  [address]  [place], [place] [id number]  [phone]           PERSON INFORMATION  Name: Jeffrey Berry, Jeffrey Berry Age:  [age] Years DOB: 12/20/1970   Sex: Male Language: English PCP: PCP,  NONE   Marital Status: Single Phone: [phone]) [phone] Med Service: MED-Medicine   MRN: [id number] Acct# [id number] Arrival: 06/24/2019 16:00:00   Visit Reason: Back pain; Medical screening exam; BACK PAIN Acuity: 3 LOS: 000 01:50   Address:    [address] [place] [id number]   Diagnosis:    Viral syndrome  Medications:    Medications Administered During Visit:                Medication Dose Route   acetaminophen 1000 mg Oral   cyclobenzaprine 10 mg Oral   hyoscyamine 0.125 mg SL               Allergies      No Known Medication Allergies      Major Tests and Procedures:  The following procedures and tests were performed during your ED visit.  COMMON PROCEDURES%>  COMMON PROCEDURES COMMENTS%>                PROVIDER INFORMATION               Provider Role Assigned Daymon LarsenUnassigned   DALU-MD, DAVID ED Provider 06/24/2019 16:02:52 06/24/2019 16:13:21   Gearldine BienenstockBURNETT-PA, JAMIE N ED MidLevel 06/24/2019 16:11:42    Catalina LungerPerrino, RN, Gillermina PhyMarni J ED Nurse 06/24/2019 16:14:34 06/24/2019 17:03:21   Cross, RN, Ginette PitmanJennifer A ED Nurse 06/24/2019 17:02:34        Attending Physician:  Gearldine BienenstockBURNETT-PA,  JAMIE N      Admit Doc  BURNETT-PA,  JAMIE N     Consulting Doc       VITALS INFORMATION  Vital Sign Triage Latest   Temp Oral ORAL_1%> ORAL%>   Temp Temporal TEMPORAL_1%> TEMPORAL%>   Temp Intravascular INTRAVASCULAR_1%> INTRAVASCULAR%>   Temp Axillary AXILLARY_1%> AXILLARY%>   Temp Rectal RECTAL_1%> RECTAL%>   02 Sat 100 % 99 %   Respiratory Rate RATE_1%> RATE%>   Peripheral Pulse Rate PULSE RATE_1%>64 bpm PULSE RATE%>   Apical Heart Rate HEART RATE_1%> HEART RATE%>   Blood Pressure BLOOD PRESSURE_1%>/ BLOOD PRESSURE_1%>98 mmHg BLOOD PRESSURE%> / BLOOD PRESSURE%>103 mmHg                 Immunizations      No Immunizations  Documented This Visit          DISCHARGE INFORMATION   Discharge Disposition: H Outpt-Sent Home   Discharge Location:  Home   Discharge Date and Time:  06/24/2019 17:50:15   ED Checkout Date and Time:  06/24/2019 17:50:15     DEPART REASON INCOMPLETE INFORMATION               Depart Action Incomplete Reason   Interactive View/I&O Recently assessed               Problems      Active           No Chronic Problems              Smoking Status      10 or more cigarettes (1/2 pack or more)/day in last 30 days  PATIENT EDUCATION INFORMATION  Instructions:     CDC self quarantine [phone]([id number]); Viral Syndrome, Easy-To-Read (CUSTOM)     Follow up:                   With: Address: When:   [facility] [address], [address] [place], [place] [id number]  [phone] Business (1) Within 2 to 4 days   Comments:   Via telehealth visit. Return to ED if symptoms worsen              ED PROVIDER DOCUMENTATION     Patient:   Jeffrey Berry, Jeffrey Berry             MRN: [id number]            FIN: [id number]               Age:   48 years     Sex:  Male     DOB:  07/25/1971   Associated Diagnoses:   Viral syndrome   Author:   Gearldine BienenstockBURNETT-PA,  JAMIE N      Basic Information   Additional information: Chief Complaint from Nursing Triage Note   Chief Complaint  Chief Complaint: back pain, chest pain and constipation (06/24/19 16:02:00).      History of Present Illness   The patient presents with 48 year old male with history of GERD and current marijuana user presents with new onset of bilateral crampy low back pain that started last night.  He also reports subjective chills, pain with taking a deep breath, nausea, vomiting, intermittent burning abdominal pain.  He has had one episode of vomiting since onset.  His symptoms do not radiate.  Nothing seems to aggravate or alleviate the symptoms.  He is tried taking nausea medicine with some relief.  He reports [facility] is [facility] and had a work-up including blood work and a  CT scan of his abdomen and pelvis.  He states he was discharged from the ER with nausea medicine.  [facility] then [facility] to be [facility] for his same symptoms and was discharged from there with nausea medicine and steroids.  States he did not receive a COVID-19 test today.  Last COVID-19 test was 2 weeks ago and was negative.  Does [facility] [place] and then in [place] abdominal pain and was treated with antibiotics.  He states he is in the process of completing 2 different antibiotics; he is unsure of the name of the antibiotics.  He denies syncope, near syncope, headache, nasal ingestion, runny nose, ear pain, sore throat, difficulty swallowing, palpitations, chest pain, cough, hemoptysis, hematemesis, hematochezia, melena, diarrhea, hematuria, dysuria, urinary urgency or frequency, penile pain or discharge, testicular pain or swelling, numbness or tingling of the lower extremities, saddle anesthesia, bowel or bladder incontinence, skin change or rash, trauma or injury, IV drug use, medication change, previous surgeries to the abdomen, history of IBS or Crohn's or ulcerative colitis or diverticulitis or pancreatitis.    I spoke with Alan Ripperlaire provider at [facility] who was able to access his medical records from [facility].  She states that the patient was at [facility] at 8 AM this morning complaining of flank pain.  At that time he received blood work including a CBC and CMP which were normal.  He also had a negative urinalysis.  He did have positive  marijuana on a drug screen.  He had a CT scan of the abdomen pelvis that was unremarkable.  While at [facility] he received a shot of Toradol and some nausea medicine as he did have an episode of vomiting there.  He then eloped from [facility].  [facility] went [facility] where he was examined by Alan Ripperlaire and felt he  had a musculoskeletal pain that was reproducible on exam.  She treated him with 5 mg of Valium, a Lidoderm patch and 10 mg of IM Decadron.  She also prescribed the patient Flexeril and prednisone and recommend primary care follow-up..        Review of Systems             Additional review of systems information: All other systems reviewed and otherwise negative.      Health Status   Allergies:    Allergic Reactions (Selected)  No Known Medication Allergies.   Medications:  (Selected)   Inpatient Medications  Ordered  Flexeril: 10 mg, 1 tabs, Oral, Once  Tylenol: 1,000 mg, 2 tabs, Oral, Once.      Past Medical/ Family/ Social History   Medical history: Reviewed as documented in chart.   Surgical history: Reviewed as documented in chart.   Family history: Not significant.   Social history: Reviewed as documented in chart.   Problem list:    Active Problems (1)  No Chronic Problems   .      Physical Examination               Vital Signs   Vital Signs   06/24/2019 16:02 EDT Systolic Blood Pressure 141 mmHg  HI    Diastolic Blood Pressure 98 mmHg  HI    Temperature Oral 36.5 degC    Heart Rate Monitored 68 bpm    Respiratory Rate 18 br/min    SpO2 100 %   .   Measurements   06/24/2019 16:03 EDT Body Mass Index est meas 20.99 kg/m2    Body Mass Index Measured 20.99 kg/m2   06/24/2019 16:02 EDT Height/Length Measured 180 cm    Weight Dosing 68 kg   .   Basic Oxygen Information   06/24/2019 16:02 EDT SpO2 100 %    Oxygen Therapy Room air   .   General:  Alert, no acute distress, Well-appearing.    Skin:  Warm, dry, intact, no rash.    Head:  Normocephalic, atraumatic.    Ears, nose, mouth and throat:  Oral mucosa moist.   Cardiovascular:  Regular rate and rhythm, No murmur, Normal peripheral perfusion, No edema.    Respiratory:  Lungs are clear to auscultation, respirations are non-labored, breath sounds are equal.    Gastrointestinal:  Soft, Nontender, Non distended, Normal bowel sounds.    Back:  No midline tenderness, no  step-offs.  Full range of motion demonstrated.  Mild bilateral tenderness palpation of the paraspinal muscles in the lower lumbar region.  Negative straight leg raise bilaterally.  No CVA tenderness..   Musculoskeletal:  Normal ROM, normal strength.    Neurological:  Alert and oriented to person, place, time, and situation, normal speech observed.    Psychiatric:  Cooperative, appropriate mood & affect.       Medical Decision Making   Documents reviewed:  Emergency department nurses' notes.   Results review:  Lab results : Lab View   06/24/2019 16:43 EDT      Trop T Quant              <  0.010 ng/mL  .   Radiology results:  Rad Results (ST)   XR Chest 1 View Portable  ?  06/24/19 16:44:13  Chest AP: 06/24/19    INDICATION:Shortness of breath (SOB).    COMPARISON: None    FINDINGS: No pneumothorax or pleural fluid. No edema or focal opacity.  Cardiomediastinal contours are normal for AP technique. No evident bony  abnormality.    IMPRESSION:  No acute cardiopulmonary abnormality.  ?  Signed By: Lorelei Pont  .   Notes:  EKG performed at 1645 reviewed by myself shows normal sinus rhythm at 75 bpm, nonspecific ST changes, no ectopy, normal intervals.  Patient is afebrile, hemodynamically stable and in good condition.  Is well-appearing.  Nontoxic-appearing.  No signs of distress.  Lungs are clear to auscultation bilaterally.  Oxygen saturations high 90s throughout ER visit.  Abdominal exam benign.  No findings to suggest cauda equina syndrome, spinal hematoma or epidural abscess.  History and physical exam are most consistent with viral syndrome.  COVID-19 test performed here.  Patient treated symptomatically here.  Recommend follow-up with his primary care provider via telehealth visit in 2 to 4 days.  Patient should closely observe her symptoms and return to the ER immediately for any new or worsening symptoms..      Reexamination/ Reevaluation   Vital signs   Basic Oxygen Information   06/24/2019 16:02 EDT SpO2  100 %    Oxygen Therapy Room air         Impression and Plan   Diagnosis   Viral syndrome (ICD10-CM B34.9, Discharge, Medical)   Plan   Condition: Stable.    Disposition: Discharged: to home.    Patient was given the following educational materials: Viral Syndrome, Easy-To-Read (CUSTOM), CDC self quarantine [phone]).    Follow up with: [facility] Within 2 to 4 days Via telehealth visit.  Return to ED if symptoms worsen.    Counseled: I had a detailed discussion with the patient and/or guardian regarding the historical points/exam findings supporting the discharge diagnosis and need for outpatient followup. Discussed the need to return to the ER if symptoms persist/worsen, or for any questions/concerns that arise at home.

## 2019-06-24 NOTE — ED Notes (Signed)
 ED Patient Summary              [facility] Emergency Department  [address], [place], [place] [address]  [phone]  Discharge Instructions (Patient)  _______________________________________     Name: Jeffrey Berry, Jeffrey Berry  DOB:  [birth year]                   MRN: [id number]                   FIN: [id number]  Reason For Visit: Back pain; Medical screening exam; BACK PAIN  Final Diagnosis: Viral syndrome     Visit Date: 06/24/2019 16:00:00  Address: [address] [place] [address]  Phone: [phone]     Primary Care Provider:      Name: PCP,  NONE      Phone:         Emergency Department Providers:         Primary Physician:            [facility] would like to thank you for allowing us  to assist you with your healthcare needs. The following includes patient education materials and information regarding your injury/illness.     Follow-up Instructions:  You were treated today on an emergency basis. If instructed, please contact your primary care provider to arrange for follow-up and for any further concerns. Whether you have been referred to your primary care doctor or a specialist, please follow-up as instructed.      If you do not have a doctor, you may call [phone] for assistance with finding a [facility] care physician or specialist. Staff is available to help schedule you an appointment.      Not sure where to go with questions about your health? We're here for you. The [facility] "Ask a Nurse" Line in staffed by experienced nurses and is a free service to the community, available Monday - Friday from 8AM to 5PM. Call [phone].      If your condition worsens before your follow-up with an outpatient physician, please return to the Emergency Department.              With: Address: When:   [facility] [address], [address] [place], [place] [address]  [phone] Business (1) Within 2 to 4 days   Comments:   Via telehealth visit. Return to ED if  symptoms worsen              Printed Prescriptions:    Patient Education Materials:  Discharge Orders          Discharge Patient 06/24/19 17:31:00 EDT  Discharge Special Instructions You will be notified by phone if your test result is positive, but it may take up to 5 days. The quickest way to view or print your test result is by the Patient Portal. To begin your Patient Portal enrollment process, please visit [email or web address]  Discharge Special Instructions Click on the Sign up now under [facility]'. If you need additional assistance on the [facility]' Patient Portal or need a copy of your COVID-19 lab test result, please call the [facility] Medical Records Office at [phone].         Comment:      CDC self quarantine [phone]); Viral Syndrome, Easy-To-Read (CUSTOM)              A patient with symptomatic laboratory-confirmed COVID-19  OR  A patient under investigation  Household  members, intimate partners, and caregivers in a nonhealthcare setting may have close contact2 with a person with symptomatic, laboratory-confirmed COVID-19 or a person under investigation. Close contacts should monitor their health; they should call their healthcare provider right away if they develop symptoms suggestive of COVID-19 (e.g., fever, cough, shortness of breath) (see Interim [place]  Guidance for Risk Assessment and Public Health Management of Persons with Potential Coronavirus Disease 2019 (COVID-19) Exposure in Travel-associated or [employer].)  Close contacts should also follow these recommendations:    Make sure that you understand and can help the patient follow their healthcare provider's instructions for medication(s) and care. You should help the patient with basic needs in the home and provide support for getting groceries, prescriptions, and other personal needs.     Monitor the patient's symptoms. If the patient is getting sicker, call his or her healthcare provider and tell them that the  patient has laboratory-confirmed COVID-19. This will help the healthcare provider's office take steps to keep other people in the office or waiting room from getting infected. Ask the healthcare provider to call the local or state health department for additional guidance. If the patient has a medical emergency and you need to call 911, notify the dispatch personnel that the patient has, or is being evaluated for COVID-19.     Household members should stay in another room or be separated from the patient as much as possible. Household members should use a separate bedroom and bathroom, if available.     Prohibit visitors who do not have an essential need to be in the home.     Household members should care for any pets in the home. Do not handle pets or other animals while sick. For more information, see COVID-19 and Animals.     Make sure that shared spaces in the home have good air flow, such as by an air conditioner or an opened window, weather permitting.     Perform hand hygiene frequently. Wash your hands often with soap and water for at least 20 seconds or use an alcohol-based hand sanitizer that contains 60 to 95% alcohol, covering all surfaces of your hands and rubbing them together until they feel dry. Soap and water should be used preferentially if hands are visibly dirty.     Avoid touching your eyes, nose, and mouth with unwashed hands.     The patient should wear a facemask when you are around other people. If the patient is not able to wear a facemask (for example, because it causes trouble breathing), you, as the caregiver, should wear a mask when you are in the same room as the patient.     Wear a disposable facemask and gloves when you touch or have contact with the patient's blood, stool, or body fluids, such as saliva, sputum, nasal mucus, vomit, urine.     Throw out disposable facemasks and gloves after using them. Do not reuse.     When removing personal protective equipment, first remove and  dispose of gloves. Then, immediately clean your hands with soap and water or alcohol-based hand sanitizer. Next, remove and dispose of facemask, and immediately clean your hands again with soap and water or alcohol-based hand sanitizer.    Avoid sharing household items with the patient. You should not share dishes, drinking glasses, cups, eating utensils, towels, bedding, or other items. After the patient uses these items, you should wash them thoroughly (see below "[employer] thoroughly").     Clean all "high-touch"  surfaces, such as counters, tabletops, doorknobs, bathroom fixtures, toilets, phones, keyboards, tablets, and bedside tables, every day. Also, clean any surfaces that may have blood, stool, or body fluids on them.     Use a household cleaning spray or wipe, according to the label instructions. Labels contain instructions for safe and effective use of the cleaning product including precautions you should take when applying the product, such as wearing gloves and making sure you have good ventilation during use of the product.    Wash laundry thoroughly.     Immediately remove and wash clothes or bedding that have blood, stool, or body fluids on them.     Wear disposable gloves while handling soiled items and keep soiled items away from your body. Clean your hands (with soap and water or an alcohol-based hand sanitizer) immediately after removing your gloves.     Read and follow directions on labels of laundry or clothing items and detergent. In general, using a normal laundry detergent according to washing machine instructions and dry thoroughly using the warmest temperatures recommended on the clothing label.    Place all used disposable gloves, facemasks, and other contaminated items in a lined container before disposing of them with other household waste. Clean your hands (with soap and water or an alcohol-based hand sanitizer) immediately after handling these items. Soap and water should be used  preferentially if hands are visibly dirty.     Discuss any additional questions with your state or local health department or healthcare provider. Check available hours when contacting your local health department.            Viral Infection  A viral infection is an illness that affects various parts of the body  It is caused by a germ called a virus.  Some examples of this kind of infection are:    . A cold.  . The flu (influenza).  . A respiratory syncytial virus (RSV) infection.        Gastroenteritis        Pharyngitis  HOW DO I KNOW IF I HAVE THIS INFECTION?  Most of the time this infection causes:  . A stuffy or runny nose.  . Yellow or green fluid in the nose.  . A cough.  . Sneezing.  . Tiredness (fatigue).  . Achy muscles.  . A sore throat.  . Sweating or chills.  . A fever.  . A headache.        Nausea, vomiting, or diarrhea  HOW IS THIS INFECTION TREATED?  If the flu is diagnosed early, it may be treated with an antiviral medicine. This medicine shortens the length of time a person has symptoms. Symptoms may be treated with over-the-counter and prescription medicines, such as:  SABRA Expectorants. These make it easier to cough up mucus.  . Decongestant nasal sprays.  Doctors do not prescribe antibiotic medicines for viral infections. They do not work with this kind of infection.  HOW DO I KNOW IF I SHOULD STAY HOME?  To keep others from getting sick, stay home if you have:  . A fever.  . A lasting cough.  . A sore throat.  . A runny nose.  . Sneezing.   . Muscles aches.  . Headaches.  . Tiredness.  . Weakness.  . Chills.  . Sweating.  . An upset stomach (nausea).  HOME CARE  . Rest as much as possible.  . Take over-the-counter and prescription medicines only as told by your doctor.  SABRA  Drink enough fluid to keep your pee (urine) clear or pale yellow.  . If the throat is sore gargle with salt water. Do this 3-4 times per day or as needed. To make a salt-water mixture, dissolve -1 tsp of salt in 1 cup of warm  water. Make sure the salt dissolves all the way.  . Use nose drops made from salt water. This helps with stuffiness (congestion). It also helps soften the skin around your nose.  . Do not drink alcohol.  . Do not use tobacco products, including cigarettes, chewing tobacco, and e-cigarettes. If you need help quitting, ask your doctor.  GET HELP IF:  . Your symptoms last for 10 days or longer.  . Your symptoms get worse over time.  . You have a fever.  . You have very bad pain in your face or forehead.  . Parts of your jaw or neck become very swollen.  GET HELP RIGHT AWAY IF:  . You feel pain or pressure in your chest.  . You have shortness of breath.  . You faint or feel like you will faint.  . You keep throwing up (vomiting).  . You feel confused.  This information is not intended to replace advice given to you by your health care provider. Make sure you discuss any questions you have with your health care provider.  Document Released: 10/03/2008 Document Revised: 10/02/2015 Document Reviewed: 03/29/2015  Elsevier Interactive Patient Education 2016 [employer].         Allergy Info: No Known Medication Allergies     Medication Information:  [facility] ED Physicians provided you with a complete list of medications post discharge, if you have been instructed to stop taking a medication please ensure you also follow up with this information to your Primary Care Physician.  Unless otherwise noted, patient will continue to take medications as prescribed prior to the Emergency Room visit.  Any specific questions regarding your chronic medications and dosages should be discussed with your physician(s) and pharmacist.          No Medications Documented      Medications Administered During Visit:              Medication Dose Route   acetaminophen 1000 mg Oral   cyclobenzaprine 10 mg Oral   hyoscyamine 0.125 mg SL          Major Tests and Procedures:  The following procedures and tests were performed during your ED  visit.  COMMON PROCEDURES%>  COMMON PROCEDURES COMMENTS%>          Laboratory Orders  Name Status Details   [address] Ordered Nasopharyng, Stat, ST - Stat, Collected, 06/24/19 16:25:00 EDT [id number], Once, 06/24/19 16:25:00 EDT, Nurse collect, Other, 06/24/19 16:57:00 EDT, RH ED, BURNETT-PA,  JAMIE N, Print label Y/N, 5 mL Naso VTM/*A*/   Trop T Completed Blood, Stat, ST - Stat, 06/24/19 16:30:00 EDT, 06/24/19 16:31:00 EDT, Nurse collect, JOHNSTON WARREN SAILOR, Print label Y/N               Radiology Orders  Name Status Details   XR Chest 1 View Portable Completed 06/24/19 16:21:00 EDT, STAT 1 hour or less, Reason: Shortness of breath  (SOB), Transport Mode: Portable, pp_set_radiology_subspecialty               Patient Care Orders  Name Status Details   COVID-19 Status Ordered 06/24/19 16:12:41 EDT, NOT VALID FOR pharmacy, laboratory, radiology., 06/24/19 16:12:41 EDT, COVID-19 PUI - under  investigation   Communication to Nursing Completed 06/24/19 16:12:00 EDT, NOT VALID FOR pharmacy, laboratory, radiology., 06/24/19 16:12:00 EDT, 06/24/19 16:12:00 EDT   Discharge Patient Ordered 06/24/19 17:31:00 EDT   Discharge Special Instructions Ordered You will be notified by phone if your test result is positive, but it may take up to 5 days. The quickest way to view or print your test result is by the Patient Portal. To begin your Patient Portal enrollment process, please visit [email or web address]   Discharge Special Instructions Ordered Click on the Sign up now under [facility]'. If you need additional assistance on the [facility]' Patient Portal or need a copy of your COVID-19 lab test result, please call the [facility] Medical Records Office at [phone].   ED Assessment Adult Completed 06/24/19 16:03:32 EDT, 06/24/19 16:03:32 EDT   ED Secondary Triage Completed 06/24/19 16:03:32 EDT, 06/24/19 16:03:32 EDT   ED Triage Adult Completed 06/24/19 16:01:00 EDT, 06/24/19 16:01:00 EDT   Notify Provider Completed  06/24/19 16:12:41 EDT, This message can only be seen by Nursing, it is not visible to Pharmacy, Laboratory, or Radiology., 06/24/19 16:12:41 EDT   Patient Isolation Ordered 06/24/19 16:12:00 EDT, Contact and Airborne, Constant Indicator       ---------------------------------------------------------------------------------------------------------------------  [facility]) encourages you to self-enroll in the [facility] Patient Portal.  [facility] Patient Portal will allow you to manage your personal health information securely from your own electronic device now and in the future.  To begin your Patient Portal enrollment process, please visit [email or web address] Click on "Sign up now" under [facility].  If you find that you need additional assistance on the [facility] Patient Portal or need a copy of your medical records, please call the [facility] Medical Records Office at [phone].  Comment:

## 2019-06-24 NOTE — ED Notes (Signed)
ED Note-Nursing       ED RN Reassessment Entered On:  06/24/2019 17:49 EDT    Performed On:  06/24/2019 17:48 EDT by Jed Limerick, RN, Ginette Pitman               ED RN Reassessment   ED Patient condition :   Alert   ED Behavioral Activity Rating Scale :   4 - Quiet and awake (normal level of activity)   ED RN Progress Note :   Pt wife spoken to by PA. Wife updated on all tests and exams run, results, pt status, pt POC, and pt needing transportation home. Pt wife notified and aware of dishcarge instructions, home care, and follow up care. Pt stable for discharge.    Jed Limerick, RN, Ginette Pitman - 06/24/2019 17:48 EDT

## 2019-06-24 NOTE — ED Notes (Signed)
ED Triage Note       ED Triage Adult Entered On:  06/24/2019 16:03 EDT    Performed On:  06/24/2019 16:02 EDT by Tyrone Nine, RN, Lindajo Royal               Triage   Chief Complaint :   back pain, chest pain and constipation   Numeric Rating Pain Scale :   7   [place] Mode of Arrival :   Walking   Infectious Disease Documentation :   Document assessment   Patient received chemo or biotherapy last 48 hrs? :   No   Temperature Oral :   36.5 degC(Converted to: 97.7 degF)    Heart Rate Monitored :   68 bpm   Respiratory Rate :   18 br/min   Systolic Blood Pressure :   141 mmHg (HI)    Diastolic Blood Pressure :   98 mmHg (HI)    SpO2 :   100 %   Oxygen Therapy :   Room air   Patient presentation :   None of the above   Chief Complaint or Presentation suggest infection :   No   Weight Dosing :   68 kg(Converted to: 149 lb 15 oz)    Height :   180 cm(Converted to: 5 ft 11 in)    Body Mass Index Dosing :   21 kg/m2   Tinnie Gens - 06/24/2019 16:02 EDT   DCP GENERIC CODE   Tracking Acuity :   3   Tracking Group :   ED [employer]   Tyrone Nine, RNLindajo Royal - 06/24/2019 16:02 EDT   ED General Section :   Document assessment   Pregnancy Status :   N/A   ED Allergies Section :   Document assessment   ED Reason for Visit Section :   Document assessment   Tinnie Gens - 06/24/2019 16:02 EDT   ID Risk Screen Symptoms   Recent Travel History :   No recent travel   Close Contact with COVID-19 ID :   No   Last 14 days COVID-19 ID :   No   TB Symptom Screen :   No symptoms   C. diff Symptom/History ID :   Neither of the above   Tyrone Nine, Dietitian - 06/24/2019 16:02 EDT   Allergies   (As Of: 06/24/2019 16:03:31 EDT)   Allergies (Active)   No Known Medication Allergies  Estimated Onset Date:   Unspecified ; Created ByFrancesca Jewett, RN, JOSEPH I; Reaction Status:   Active ; Category:   Drug ; Substance:   No Known Medication Allergies ; Type:   Allergy ; Updated By:   Fortino Sic; Reviewed Date:   06/24/2019 16:02  EDT        Psycho-Social   Last 3 mo, thoughts killing self/others :   Patient denies   Tinnie Gens - 06/24/2019 16:02 EDT   ED Reason for Visit   (As Of: 06/24/2019 16:03:31 EDT)   Problems(Active)    No Chronic Problems (Cerner  :NKP )  Name of Problem:   No Chronic Problems ; Recorder:   Francesca Jewett, RN, JOSEPH I; Code:   NKP ; Last Updated:   06/02/2019 19:57 EDT ; Life Cycle Date:   06/02/2019 ; Life Cycle Status:   Active ; Vocabulary:   Cerner          Diagnoses(Active)  Medical screening exam  Date:   06/24/2019 ; Diagnosis Type:   Reason For Visit ; Confirmation:   Complaint of ; Clinical Dx:   Medical screening exam ; Classification:   Medical ; Clinical Service:   Non-Specified ; Code:   PNED ; Probability:   0 ; Diagnosis Code:   [id number]
